# Patient Record
Sex: Female | Born: 1945 | Race: White | Hispanic: No | Smoking: Never smoker
Health system: Southern US, Community
[De-identification: ages and names within clinical notes are randomized; demographics above are authoritative.]

## PROBLEM LIST (undated history)

## (undated) DIAGNOSIS — R42 Dizziness and giddiness: Secondary | ICD-10-CM

## (undated) DIAGNOSIS — Z780 Asymptomatic menopausal state: Secondary | ICD-10-CM

## (undated) DIAGNOSIS — I872 Venous insufficiency (chronic) (peripheral): Secondary | ICD-10-CM

## (undated) DIAGNOSIS — H9190 Unspecified hearing loss, unspecified ear: Secondary | ICD-10-CM

## (undated) DIAGNOSIS — I82409 Acute embolism and thrombosis of unspecified deep veins of unspecified lower extremity: Secondary | ICD-10-CM

## (undated) DIAGNOSIS — E559 Vitamin D deficiency, unspecified: Secondary | ICD-10-CM

## (undated) DIAGNOSIS — N200 Calculus of kidney: Secondary | ICD-10-CM

## (undated) DIAGNOSIS — K219 Gastro-esophageal reflux disease without esophagitis: Secondary | ICD-10-CM

## (undated) DIAGNOSIS — L719 Rosacea, unspecified: Secondary | ICD-10-CM

## (undated) DIAGNOSIS — J309 Allergic rhinitis, unspecified: Secondary | ICD-10-CM

## (undated) DIAGNOSIS — E785 Hyperlipidemia, unspecified: Secondary | ICD-10-CM

## (undated) DIAGNOSIS — H8109 Meniere's disease, unspecified ear: Secondary | ICD-10-CM

## (undated) DIAGNOSIS — M1990 Unspecified osteoarthritis, unspecified site: Secondary | ICD-10-CM

## (undated) DIAGNOSIS — M810 Age-related osteoporosis without current pathological fracture: Secondary | ICD-10-CM

## (undated) DIAGNOSIS — N289 Disorder of kidney and ureter, unspecified: Secondary | ICD-10-CM

## (undated) DIAGNOSIS — E042 Nontoxic multinodular goiter: Secondary | ICD-10-CM

## (undated) HISTORY — DX: Venous insufficiency (chronic) (peripheral): I87.2

## (undated) HISTORY — PX: TONSILLECTOMY: SUR1361

## (undated) HISTORY — DX: Dizziness and giddiness: R42

## (undated) HISTORY — DX: Gastro-esophageal reflux disease without esophagitis: K21.9

## (undated) HISTORY — DX: Meniere's disease, unspecified ear: H81.09

## (undated) HISTORY — DX: Unspecified osteoarthritis, unspecified site: M19.90

## (undated) HISTORY — DX: Nontoxic multinodular goiter: E04.2

## (undated) HISTORY — DX: Acute embolism and thrombosis of unspecified deep veins of unspecified lower extremity: I82.409

## (undated) HISTORY — PX: LITHOTRIPSY: SUR834

## (undated) HISTORY — DX: Unspecified hearing loss, unspecified ear: H91.90

## (undated) HISTORY — DX: Asymptomatic menopausal state: Z78.0

## (undated) HISTORY — DX: Allergic rhinitis, unspecified: J30.9

## (undated) HISTORY — DX: Vitamin D deficiency, unspecified: E55.9

## (undated) HISTORY — DX: Age-related osteoporosis without current pathological fracture: M81.0

## (undated) HISTORY — DX: Rosacea, unspecified: L71.9

---

## 1988-07-09 HISTORY — PX: TOTAL ABDOMINAL HYSTERECTOMY W/ BILATERAL SALPINGOOPHORECTOMY: SHX83

## 2003-07-10 HISTORY — PX: CARDIAC CATHETERIZATION: SHX172

## 2007-11-11 ENCOUNTER — Encounter: Admission: RE | Admit: 2007-11-11 | Discharge: 2007-11-11 | Payer: Self-pay | Admitting: Rheumatology

## 2008-06-24 ENCOUNTER — Ambulatory Visit: Payer: Self-pay | Admitting: Diagnostic Radiology

## 2008-06-24 ENCOUNTER — Ambulatory Visit (HOSPITAL_BASED_OUTPATIENT_CLINIC_OR_DEPARTMENT_OTHER): Admission: RE | Admit: 2008-06-24 | Discharge: 2008-06-24 | Payer: Self-pay | Admitting: Family Medicine

## 2008-06-25 ENCOUNTER — Emergency Department (HOSPITAL_BASED_OUTPATIENT_CLINIC_OR_DEPARTMENT_OTHER): Admission: EM | Admit: 2008-06-25 | Discharge: 2008-06-25 | Payer: Self-pay | Admitting: Emergency Medicine

## 2008-06-25 ENCOUNTER — Ambulatory Visit: Payer: Self-pay | Admitting: Diagnostic Radiology

## 2008-07-09 DIAGNOSIS — I82409 Acute embolism and thrombosis of unspecified deep veins of unspecified lower extremity: Secondary | ICD-10-CM

## 2008-07-09 HISTORY — DX: Acute embolism and thrombosis of unspecified deep veins of unspecified lower extremity: I82.409

## 2008-07-29 ENCOUNTER — Emergency Department (HOSPITAL_BASED_OUTPATIENT_CLINIC_OR_DEPARTMENT_OTHER): Admission: EM | Admit: 2008-07-29 | Discharge: 2008-07-29 | Payer: Self-pay | Admitting: Emergency Medicine

## 2008-07-29 ENCOUNTER — Ambulatory Visit: Payer: Self-pay | Admitting: Radiology

## 2009-02-24 ENCOUNTER — Ambulatory Visit: Payer: Self-pay | Admitting: Diagnostic Radiology

## 2009-02-24 ENCOUNTER — Ambulatory Visit (HOSPITAL_BASED_OUTPATIENT_CLINIC_OR_DEPARTMENT_OTHER): Admission: RE | Admit: 2009-02-24 | Discharge: 2009-02-24 | Payer: Self-pay | Admitting: Family Medicine

## 2009-09-16 ENCOUNTER — Ambulatory Visit: Payer: Self-pay | Admitting: Diagnostic Radiology

## 2009-09-16 ENCOUNTER — Ambulatory Visit (HOSPITAL_BASED_OUTPATIENT_CLINIC_OR_DEPARTMENT_OTHER): Admission: RE | Admit: 2009-09-16 | Discharge: 2009-09-16 | Payer: Self-pay | Admitting: Family Medicine

## 2010-02-02 ENCOUNTER — Encounter: Admission: RE | Admit: 2010-02-02 | Discharge: 2010-02-02 | Payer: Self-pay | Admitting: Otolaryngology

## 2010-10-15 IMAGING — CR DG CHEST 2V
2 series · 2 of 2 positions shown · non-contrast
Comparison: 11/11/2007 study

CLINICAL DATA: History given of coughing and shortness of breath.
There is a history of asthma.

CHEST - 2 VIEW

[w chest pa]
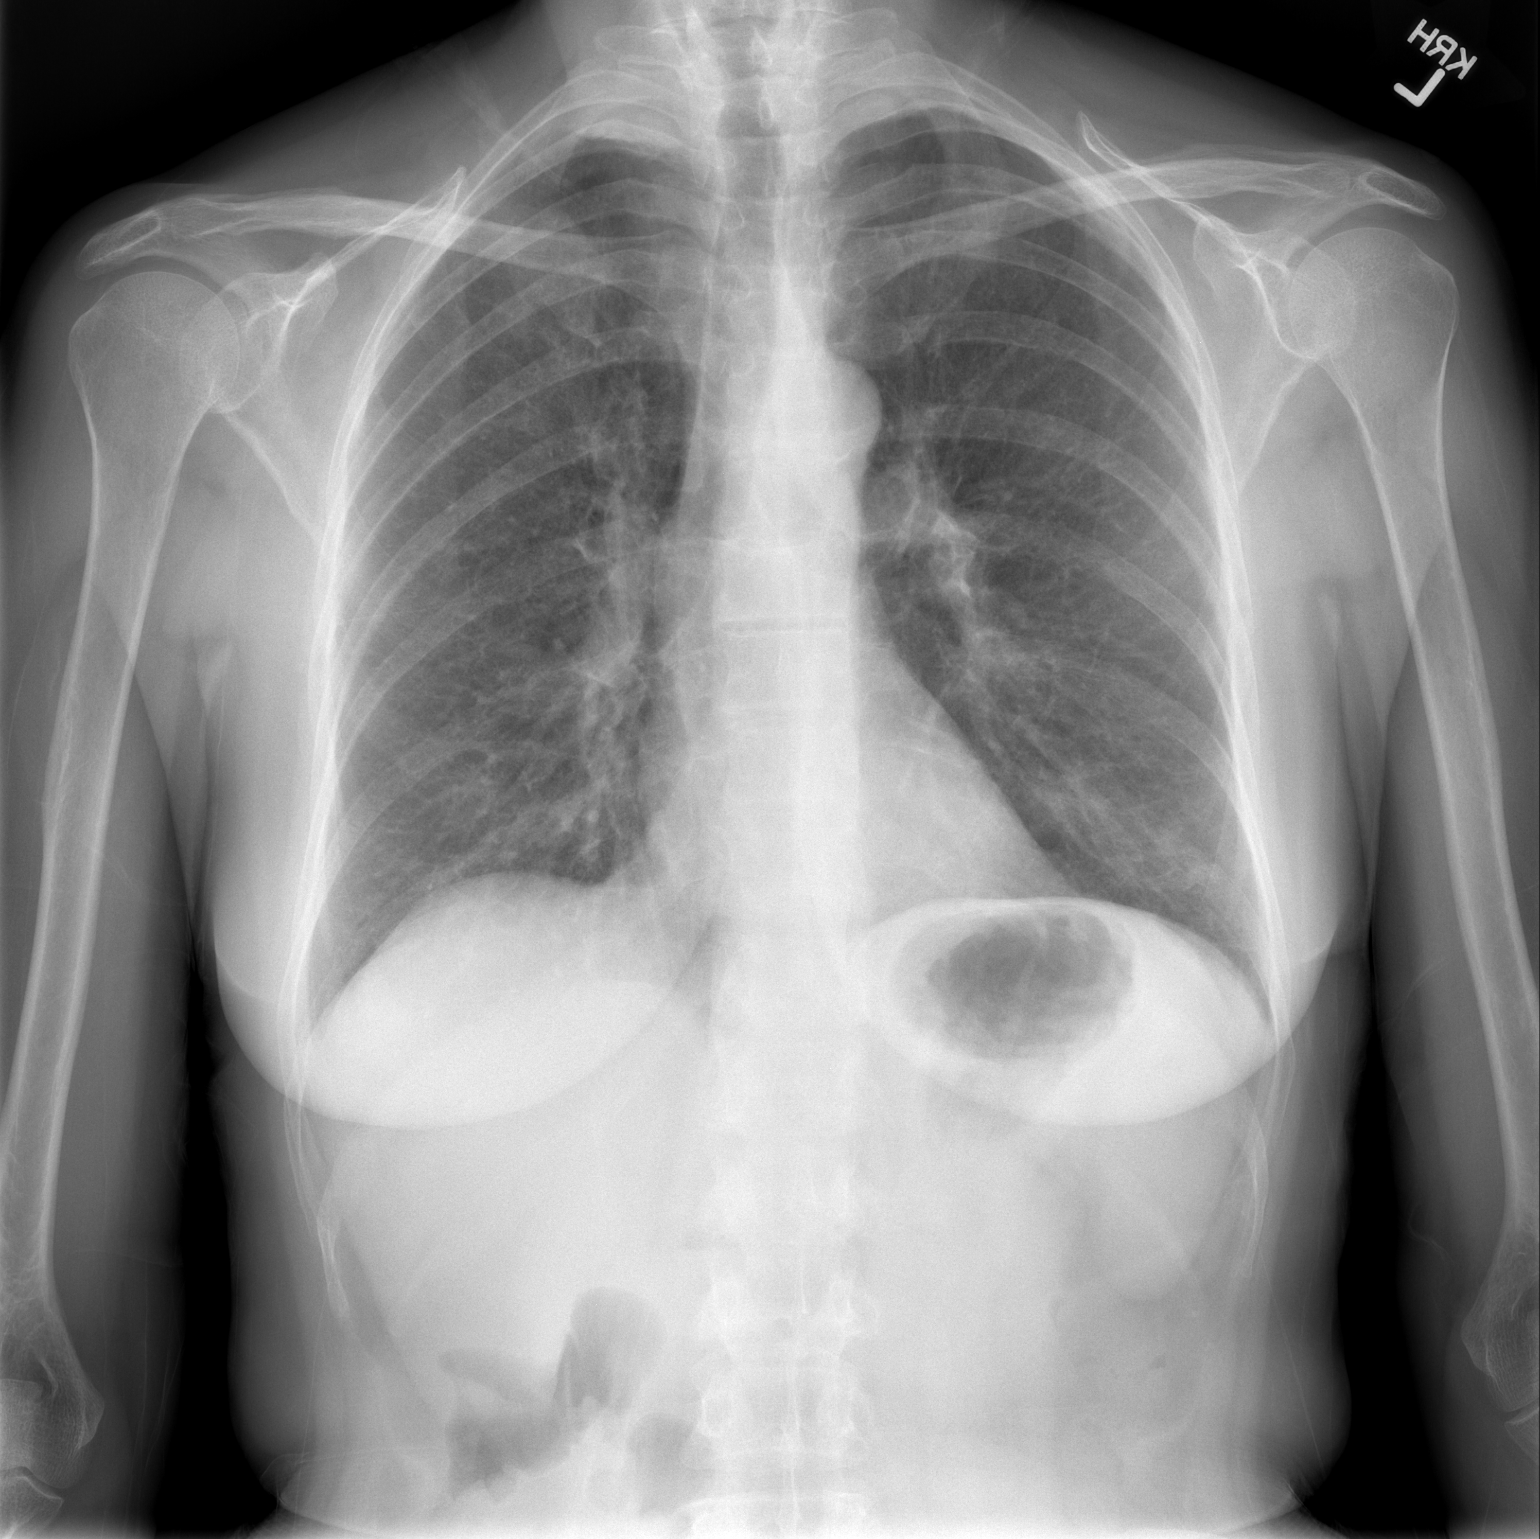

[w chest lat]
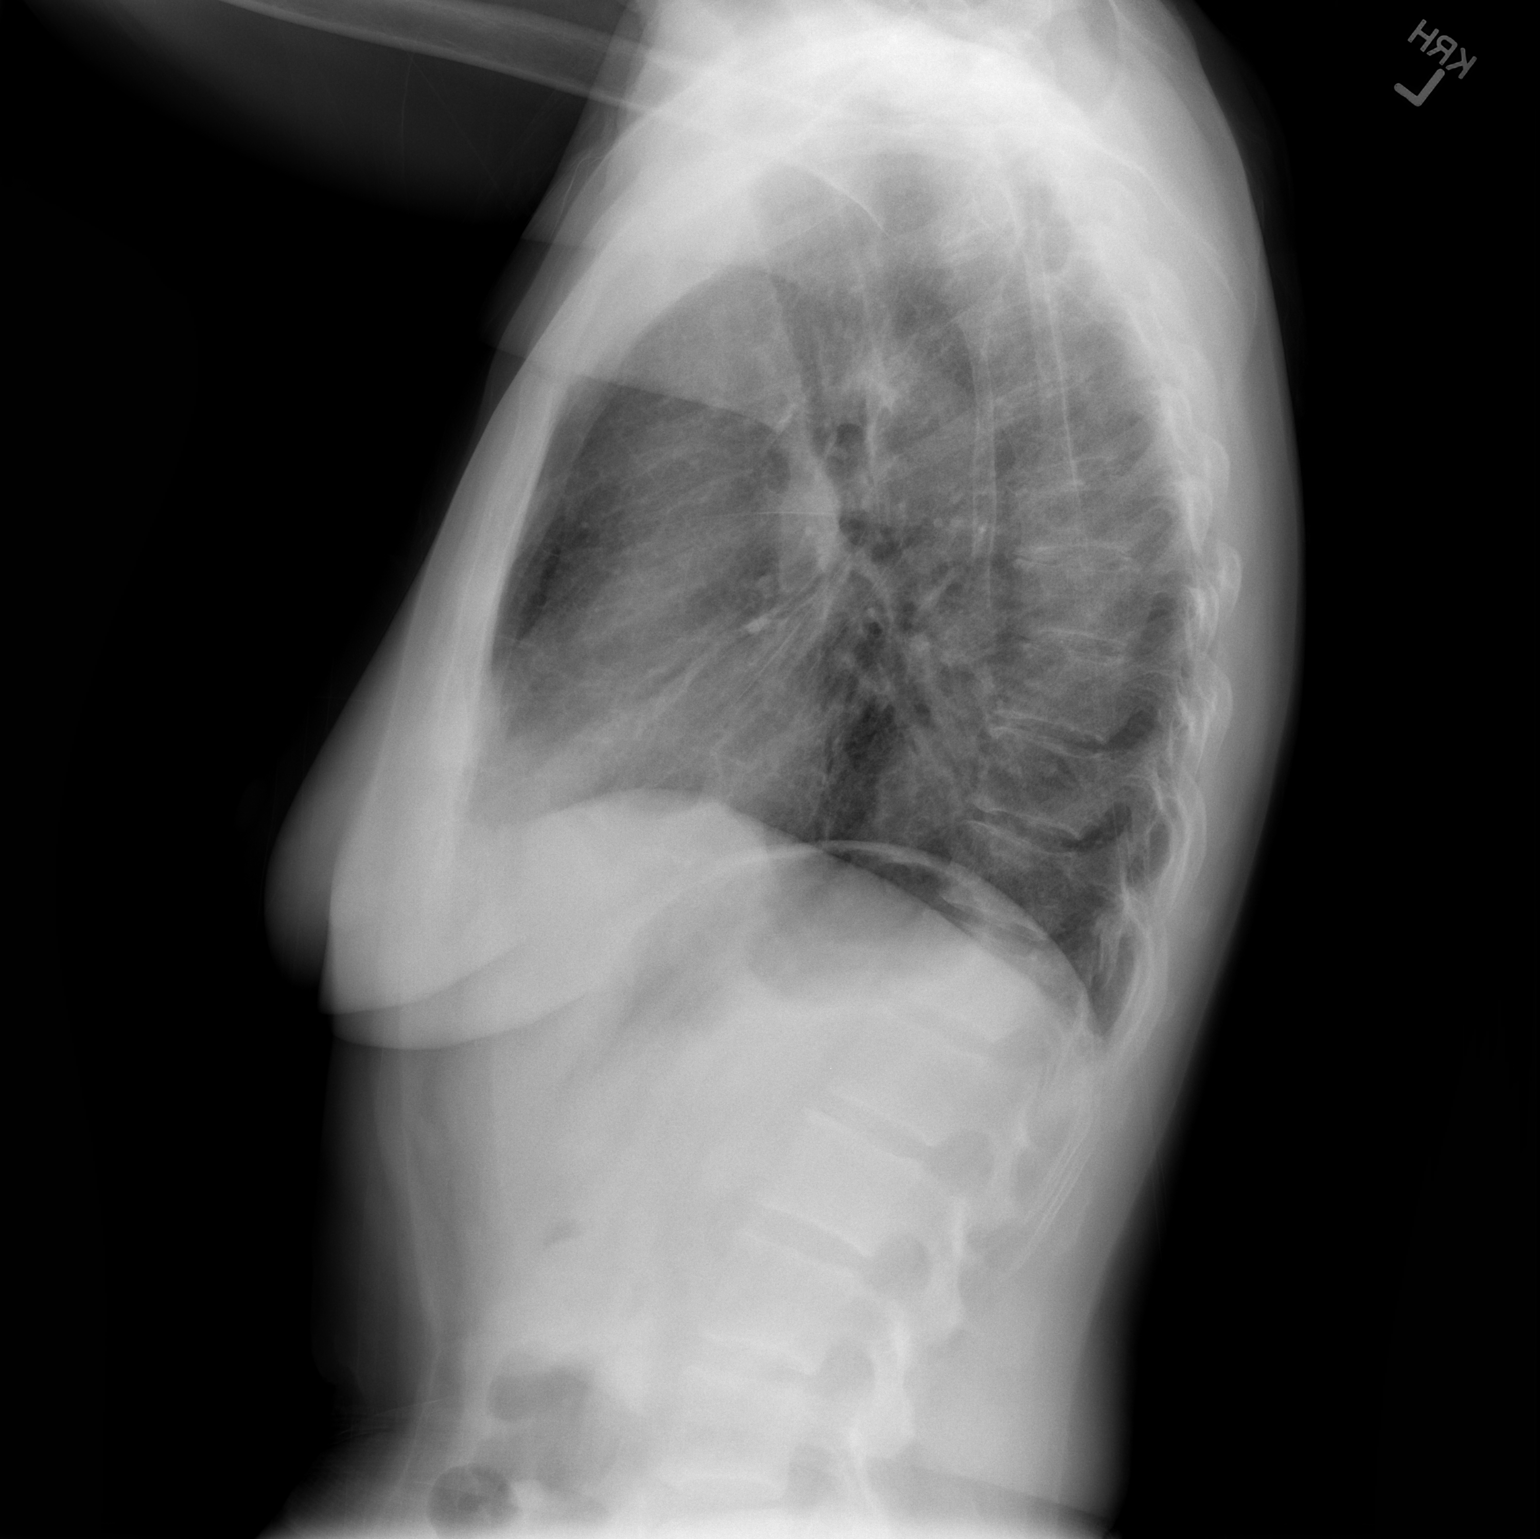

[2 of 2 positions shown; findings below may reference images not displayed]

FINDINGS: Cardiac silhouette is normal size and shape.  Lungs are
free of infiltrates.  There is slight hyperinflation configuration
with very slight flattening of diaphragm on lateral image.  No
pleural effusion is evident.  Nonaneurysmal aortic calcification is
present.  There is a slightly osteopenic appearance of the bones
with degenerative spondylosis.
IMPRESSION: There is a slight hyperinflation configuration with slight
flattening of diaphragm on lateral image.  No pulmonary edema,
pneumonia, pleural effusion or other acute process is seen.

## 2010-10-23 LAB — CBC
MCHC: 33.7 g/dL (ref 30.0–36.0)
MCV: 94.6 fL (ref 78.0–100.0)
RBC: 4.13 MIL/uL (ref 3.87–5.11)
RDW: 12 % (ref 11.5–15.5)

## 2010-10-23 LAB — DIFFERENTIAL
Basophils Relative: 1 % (ref 0–1)
Eosinophils Absolute: 0.1 10*3/uL (ref 0.0–0.7)
Eosinophils Relative: 2 % (ref 0–5)
Lymphs Abs: 1.5 10*3/uL (ref 0.7–4.0)
Monocytes Relative: 11 % (ref 3–12)

## 2010-10-23 LAB — BASIC METABOLIC PANEL
CO2: 25 mEq/L (ref 19–32)
Chloride: 99 mEq/L (ref 96–112)
Creatinine, Ser: 1.7 mg/dL — ABNORMAL HIGH (ref 0.4–1.2)
GFR calc Af Amer: 37 mL/min — ABNORMAL LOW (ref >60)
Glucose, Bld: 142 mg/dL — ABNORMAL HIGH (ref 70–99)

## 2010-10-23 LAB — SEDIMENTATION RATE: Sed Rate: 36 mm/hr — ABNORMAL HIGH (ref 0–22)

## 2010-10-23 LAB — CK: Total CK: 30 U/L (ref 7–177)

## 2010-12-07 ENCOUNTER — Ambulatory Visit (HOSPITAL_BASED_OUTPATIENT_CLINIC_OR_DEPARTMENT_OTHER)
Admission: RE | Admit: 2010-12-07 | Discharge: 2010-12-07 | Disposition: A | Discharge status: Home / Self Care | Payer: Medicare Other | Source: Ambulatory Visit | Attending: Family Medicine | Admitting: Family Medicine

## 2010-12-07 ENCOUNTER — Other Ambulatory Visit (HOSPITAL_BASED_OUTPATIENT_CLINIC_OR_DEPARTMENT_OTHER): Payer: Self-pay | Admitting: Family Medicine

## 2010-12-07 DIAGNOSIS — M79606 Pain in leg, unspecified: Secondary | ICD-10-CM

## 2010-12-07 DIAGNOSIS — M79609 Pain in unspecified limb: Secondary | ICD-10-CM | POA: Insufficient documentation

## 2011-02-27 ENCOUNTER — Encounter: Payer: Self-pay | Admitting: *Deleted

## 2011-02-27 ENCOUNTER — Emergency Department (HOSPITAL_BASED_OUTPATIENT_CLINIC_OR_DEPARTMENT_OTHER)
Admission: EM | Admit: 2011-02-27 | Discharge: 2011-02-27 | Disposition: A | Discharge status: Home / Self Care | Payer: Medicare Other | Attending: Emergency Medicine | Admitting: Emergency Medicine

## 2011-02-27 DIAGNOSIS — W2203XA Walked into furniture, initial encounter: Secondary | ICD-10-CM | POA: Insufficient documentation

## 2011-02-27 DIAGNOSIS — Y92009 Unspecified place in unspecified non-institutional (private) residence as the place of occurrence of the external cause: Secondary | ICD-10-CM | POA: Insufficient documentation

## 2011-02-27 DIAGNOSIS — S91009A Unspecified open wound, unspecified ankle, initial encounter: Secondary | ICD-10-CM | POA: Insufficient documentation

## 2011-02-27 DIAGNOSIS — E785 Hyperlipidemia, unspecified: Secondary | ICD-10-CM | POA: Insufficient documentation

## 2011-02-27 DIAGNOSIS — J45909 Unspecified asthma, uncomplicated: Secondary | ICD-10-CM | POA: Insufficient documentation

## 2011-02-27 DIAGNOSIS — S81009A Unspecified open wound, unspecified knee, initial encounter: Secondary | ICD-10-CM | POA: Insufficient documentation

## 2011-02-27 DIAGNOSIS — S81811A Laceration without foreign body, right lower leg, initial encounter: Secondary | ICD-10-CM

## 2011-02-27 HISTORY — DX: Hyperlipidemia, unspecified: E78.5

## 2011-02-27 HISTORY — DX: Calculus of kidney: N20.0

## 2011-02-27 MED ORDER — LIDOCAINE HCL 2 % IJ SOLN
5.0000 mL | Freq: Once | INTRAMUSCULAR | Status: AC
  Administered 2011-02-27: 100 mg
  Filled 2011-02-27: qty 1

## 2011-02-27 NOTE — ED Provider Notes (Signed)
History     CSN: [id number] Arrival date & time: 02/27/2011  5:22 PM  Chief Complaint  Patient presents with  . Laceration   HPI Comments: Patient suffered a laceration of the right lower leg on a piece of furniture in her home today. She had a tetanus shot about 3 years ago.  Patient is a 65 y.o. female presenting with skin laceration. The history is provided by the patient. No language interpreter was used.  Laceration  The incident occurred less than 1 hour ago. The laceration is located on the right leg. The laceration is 3 cm in size. Injury mechanism: Her laceration was caused by bumping into a piece of furniture. The pain is at a severity of 8/10. The pain is moderate. The pain has been constant since onset. She reports no foreign bodies present. Her tetanus status is UTD.    Past Medical History  Diagnosis Date  . Hyperlipemia   . Asthma   . Kidney calculi     Past Surgical History  Procedure Date  . Abdominal hysterectomy   . Tonsillectomy   . Cardiac catheterization   . Lithotripsy     History reviewed. No pertinent family history.  History  Substance Use Topics  . Smoking status: Never Smoker   . Smokeless tobacco: Not on file  . Alcohol Use: No    OB History    Grav Para Term Preterm Abortions TAB SAB Ect Mult Living                  Review of Systems  All other systems reviewed and are negative.    Physical Exam  BP 115/58  Pulse 83  Temp 98.1 F (36.7 C)  Resp 16  Wt 132 lb (59.875 kg)  SpO2 100%  Physical Exam  Constitutional: She is oriented to person, place, and time.       Patient is a slender elderly lady in mild distress. She has a splint on her right hand.  HENT:  Head: Normocephalic and atraumatic.  Eyes: EOM are normal.  Neck: Normal range of motion. Neck supple.  Cardiovascular: Normal rate and regular rhythm.   Pulmonary/Chest: Effort normal and breath sounds normal.  Abdominal: Soft.  Musculoskeletal: Normal range of  motion.  Neurological: She is alert and oriented to person, place, and time.  Skin:       She has a 3 cm laterally based flap laceration on the lateral aspect of her right calf. There is no foreign body in the wound. There is no loss of sensation or tendon function in the right foot.  Psychiatric: She has a normal mood and affect. Her behavior is normal.    ED Course  LACERATION REPAIR Date/Time: 02/27/2011 7:30 PM Performed by: Osvaldo Human Authorized by: Osvaldo Human Consent: Verbal consent obtained. Consent given by: patient Patient understanding: patient states understanding of the procedure being performed Patient consent: the patient's understanding of the procedure matches consent given Patient identity confirmed: verbally with patient Time out: Immediately prior to procedure a "time out" was called to verify the correct patient, procedure, equipment, support staff and site/side marked as required. Body area: lower extremity Location details: right lower leg Laceration length: 3 cm Foreign bodies: no foreign bodies Tendon involvement: none Nerve involvement: none Vascular damage: yes Anesthesia: local infiltration Local anesthetic: lidocaine 2% without epinephrine Patient sedated: no Irrigation solution: saline Amount of cleaning: standard Debridement: none Degree of undermining: none Skin closure: 5-0 nylon Number of sutures:  7 Technique: simple Approximation: loose Approximation difficulty: simple Dressing: 4x4 sterile gauze and pressure dressing Patient tolerance: Patient tolerated the procedure well with no immediate complications.         Carleene Cooper III, MD 02/27/11 [phone]

## 2011-02-27 NOTE — ED Notes (Signed)
Dr Ignacia Palma at bedside to suture pt

## 2011-02-27 NOTE — ED Notes (Signed)
Wet to Dry dressing applied to wound.

## 2011-02-27 NOTE — ED Notes (Signed)
Pt  C/o laceration to right lower leg x 30 mins ago by wooden table

## 2011-03-09 ENCOUNTER — Emergency Department (HOSPITAL_BASED_OUTPATIENT_CLINIC_OR_DEPARTMENT_OTHER)
Admission: EM | Admit: 2011-03-09 | Discharge: 2011-03-09 | Disposition: A | Discharge status: Home / Self Care | Payer: Medicare Other | Attending: Emergency Medicine | Admitting: Emergency Medicine

## 2011-03-09 ENCOUNTER — Encounter (HOSPITAL_BASED_OUTPATIENT_CLINIC_OR_DEPARTMENT_OTHER): Payer: Self-pay

## 2011-03-09 DIAGNOSIS — IMO0002 Reserved for concepts with insufficient information to code with codable children: Secondary | ICD-10-CM

## 2011-03-09 DIAGNOSIS — J45909 Unspecified asthma, uncomplicated: Secondary | ICD-10-CM | POA: Insufficient documentation

## 2011-03-09 DIAGNOSIS — Z4802 Encounter for removal of sutures: Secondary | ICD-10-CM | POA: Insufficient documentation

## 2011-03-09 DIAGNOSIS — E785 Hyperlipidemia, unspecified: Secondary | ICD-10-CM | POA: Insufficient documentation

## 2011-03-09 NOTE — ED Provider Notes (Signed)
History     CSN: [id number] Arrival date & time: 03/09/2011  6:29 PM  Chief Complaint  Patient presents with  . Suture / Staple Removal   Patient is a 65 y.o. female presenting with suture removal. The history is provided by the patient. No language interpreter was used.  Suture / Staple Removal  The sutures were placed 11 to 14 days ago. Treatments since wound repair include antibiotic ointment use. Her temperature was unmeasured prior to arrival. There has been no drainage from the wound. There is no redness present. There is no swelling present. The pain has improved. She has no difficulty moving the affected extremity or digit.    Past Medical History  Diagnosis Date  . Hyperlipemia   . Asthma   . Kidney calculi     Past Surgical History  Procedure Date  . Abdominal hysterectomy   . Tonsillectomy   . Cardiac catheterization   . Lithotripsy     No family history on file.  History  Substance Use Topics  . Smoking status: Never Smoker   . Smokeless tobacco: Not on file  . Alcohol Use: No    OB History    Grav Para Term Preterm Abortions TAB SAB Ect Mult Living                  Review of Systems  Skin: Positive for wound.  All other systems reviewed and are negative.    Physical Exam  BP 105/57  Pulse 84  Temp(Src) 98.3 F (36.8 C) (Oral)  Resp 16  Ht 5\' 1"  (1.549 m)  Wt 132 lb (59.875 kg)  BMI 24.94 kg/m2  SpO2 98%  Physical Exam  Nursing note and vitals reviewed. Constitutional: She appears well-developed and well-nourished.  HENT:  Head: Normocephalic.  Neurological: She is alert.  Skin: Skin is warm.       Healing laceration,  nv and ns intact    ED Course  Procedures  MDM Sutures removed      Langston Masker, [place] 03/09/11 1922

## 2011-03-09 NOTE — ED Notes (Signed)
Suture removal right LE

## 2011-03-09 NOTE — ED Notes (Signed)
Report given to Kyle Dangerfield, RN 

## 2011-03-10 NOTE — ED Provider Notes (Signed)
Medical screening examination/treatment/procedure(s) were performed by non-physician practitioner and as supervising physician I was immediately available for consultation/collaboration.   Leigh-Ann Webb, MD 03/10/11 0112 

## 2011-04-13 LAB — COMPREHENSIVE METABOLIC PANEL
ALT: 19 U/L (ref 0–35)
Albumin: 4.4 g/dL (ref 3.5–5.2)
Calcium: 9.8 mg/dL (ref 8.4–10.5)
GFR calc Af Amer: 39 mL/min — ABNORMAL LOW (ref >60)
GFR calc non Af Amer: 33 mL/min — ABNORMAL LOW (ref >60)
Glucose, Bld: 141 mg/dL — ABNORMAL HIGH (ref 70–99)
Potassium: 3.9 mEq/L (ref 3.5–5.1)
Sodium: 136 mEq/L (ref 135–145)
Total Bilirubin: 1.1 mg/dL (ref 0.3–1.2)
Total Protein: 8 g/dL (ref 6.0–8.3)

## 2011-04-13 LAB — DIFFERENTIAL
Basophils Absolute: 0.2 10*3/uL — ABNORMAL HIGH (ref 0.0–0.1)
Basophils Relative: 2 % — ABNORMAL HIGH (ref 0–1)
Eosinophils Relative: 0 % (ref 0–5)
Lymphocytes Relative: 13 % (ref 12–46)
Neutro Abs: 8.1 10*3/uL — ABNORMAL HIGH (ref 1.7–7.7)

## 2011-04-13 LAB — URINALYSIS, ROUTINE W REFLEX MICROSCOPIC
Bilirubin Urine: NEGATIVE
Nitrite: NEGATIVE
Protein, ur: NEGATIVE mg/dL
Specific Gravity, Urine: 1.009 (ref 1.005–1.030)
Urobilinogen, UA: 0.2 mg/dL (ref 0.0–1.0)

## 2011-04-13 LAB — CBC
MCHC: 35.1 g/dL (ref 30.0–36.0)
Platelets: 284 10*3/uL (ref 150–400)
RDW: 12.7 % (ref 11.5–15.5)

## 2011-04-13 LAB — LIPASE, BLOOD: Lipase: 20 U/L — ABNORMAL LOW (ref 23–300)

## 2011-04-13 LAB — POCT CARDIAC MARKERS: Myoglobin, poc: >500 ng/mL (ref 12–200)

## 2011-12-20 ENCOUNTER — Ambulatory Visit (HOSPITAL_BASED_OUTPATIENT_CLINIC_OR_DEPARTMENT_OTHER)
Admission: RE | Admit: 2011-12-20 | Discharge: 2011-12-20 | Disposition: A | Discharge status: Home / Self Care | Payer: Medicare Other | Source: Ambulatory Visit | Attending: Internal Medicine | Admitting: Internal Medicine

## 2011-12-20 ENCOUNTER — Encounter: Payer: Self-pay | Admitting: Internal Medicine

## 2011-12-20 ENCOUNTER — Other Ambulatory Visit: Payer: Self-pay | Admitting: Internal Medicine

## 2011-12-20 ENCOUNTER — Ambulatory Visit (INDEPENDENT_AMBULATORY_CARE_PROVIDER_SITE_OTHER): Payer: Medicare Other | Admitting: Internal Medicine

## 2011-12-20 VITALS — BP 104/64 | HR 98 | Temp 97.1°F | Resp 16 | Ht 61.0 in | Wt 135.0 lb

## 2011-12-20 DIAGNOSIS — E785 Hyperlipidemia, unspecified: Secondary | ICD-10-CM

## 2011-12-20 DIAGNOSIS — M7989 Other specified soft tissue disorders: Secondary | ICD-10-CM | POA: Insufficient documentation

## 2011-12-20 DIAGNOSIS — R52 Pain, unspecified: Secondary | ICD-10-CM

## 2011-12-20 DIAGNOSIS — J309 Allergic rhinitis, unspecified: Secondary | ICD-10-CM

## 2011-12-20 DIAGNOSIS — K219 Gastro-esophageal reflux disease without esophagitis: Secondary | ICD-10-CM

## 2011-12-20 DIAGNOSIS — M1990 Unspecified osteoarthritis, unspecified site: Secondary | ICD-10-CM

## 2011-12-20 DIAGNOSIS — Z86718 Personal history of other venous thrombosis and embolism: Secondary | ICD-10-CM

## 2011-12-20 DIAGNOSIS — R609 Edema, unspecified: Secondary | ICD-10-CM

## 2011-12-20 DIAGNOSIS — Z8639 Personal history of other endocrine, nutritional and metabolic disease: Secondary | ICD-10-CM

## 2011-12-20 DIAGNOSIS — Z780 Asymptomatic menopausal state: Secondary | ICD-10-CM

## 2011-12-20 DIAGNOSIS — R600 Localized edema: Secondary | ICD-10-CM

## 2011-12-20 DIAGNOSIS — Z87442 Personal history of urinary calculi: Secondary | ICD-10-CM

## 2011-12-20 NOTE — Progress Notes (Signed)
Subjective:    Patient ID: Betty Atkinson, female    DOB: [birth year], 66 y.o.   MRN: [id number]  HPI Betty Atkinson is a pt. Of Dr. Alberteen Sam here with acute visit.  She has a PMH of RLE DVT treated with 6 months of Coumadin.  She has been having pain in R leg.    Some swelling in R ankle 2 months ago but none now.  Swelling will occur at times in both legs.    Allergies  Allergen Reactions  . Methotrexate Derivatives Anaphylaxis    Cough, liver failure  . Lyrica (Pregabalin)     Slurred speech, blurred vision, confusion, incohert  . Prednisone Swelling    Hives on face  . Sulfa Antibiotics Diarrhea    indigestion   Past Medical History  Diagnosis Date  . Hyperlipemia   . Asthma   . Kidney calculi   . DVT (deep venous thrombosis)   . Meniere's disease   . Syncope    Past Surgical History  Procedure Date  . Abdominal hysterectomy   . Tonsillectomy   . Cardiac catheterization   . Lithotripsy    History   Social History  . Marital Status: Married    Spouse Name: N/A    Number of Children: N/A  . Years of Education: N/A   Occupational History  . retired    Social History Main Topics  . Smoking status: Never Smoker   . Smokeless tobacco: Never Used  . Alcohol Use: No  . Drug Use: Not on file  . Sexually Active: Yes -- Female partner(s)   Other Topics Concern  . Not on file   Social History Narrative  . No narrative on file   Family History  Problem Relation Age of Onset  . Thyroid disease Father   . Heart disease Mother   . Heart disease Father   . Cancer Brother     fibrohisteofibroma  . Heart disease Brother    Patient Active Problem List  Diagnosis  . History of DVT (deep vein thrombosis)  . H/O renal calculi  . Personal history of goiter  . DJD (degenerative joint disease)  . Other and unspecified hyperlipidemia  . GERD (gastroesophageal reflux disease)  . Menopause  . Allergic rhinitis   Current Outpatient Prescriptions on File Prior to Visit  Medication  Sig Dispense Refill  . acetaminophen (TYLENOL) 500 MG tablet Take 1,000 mg by mouth daily as needed.        Marland Kitchen aspirin 81 MG chewable tablet Chew 81 mg by mouth daily.        . carboxymethylcellulose (REFRESH PLUS) 0.5 % SOLN Place 2 drops into both eyes 3 (three) times daily as needed. Seasonal allergies       . diazepam (VALIUM) 5 MG tablet Take 5 mg by mouth every 6 (six) hours as needed. Manire's disease flare up       . ezetimibe (ZETIA) 10 MG tablet Take 10 mg by mouth daily.        Marland Kitchen levothyroxine (SYNTHROID, LEVOTHROID) 50 MCG tablet Take 50 mcg by mouth daily.        . Magnesium 500 MG CAPS Take 1 capsule by mouth daily.        . mometasone (ASMANEX) 220 MCG/INH inhaler Inhale 1 puff into the lungs daily.        . mometasone (NASONEX) 50 MCG/ACT nasal spray Place 2 sprays into the nose at bedtime.        . montelukast (SINGULAIR)  10 MG tablet Take 10 mg by mouth at bedtime.        Marland Kitchen omeprazole (PRILOSEC) 40 MG capsule Take 40 mg by mouth daily.        . phenylephrine (SUDAFED PE) 10 MG TABS Take 20 mg by mouth daily.        . potassium chloride (KLOR-CON) 10 MEQ CR tablet Take 10 mEq by mouth daily.        . Riboflavin (VITAMIN B-2 PO) Take 400 mg by mouth 2 (two) times daily.        . rosuvastatin (CRESTOR) 40 MG tablet Take 40 mg by mouth daily.        Marland Kitchen triamterene-hydrochlorothiazide (DYAZIDE) 37.5-25 MG per capsule Take 1 capsule by mouth every morning.        . Vitamin D, Ergocalciferol, (DRISDOL) 50000 UNITS CAPS Take 50,000 Units by mouth every 7 (seven) days. Take on Sunday       . vitamin E 400 UNIT capsule Take 400 Units by mouth daily.             Review of Systems See HPI    Objective:   Physical Exam Physical Exam  Nursing note and vitals reviewed.  Constitutional: She is oriented to person, place, and time. She appears well-developed and well-nourished.  HENT:  Head: Normocephalic and atraumatic.  Cardiovascular: Normal rate and regular rhythm. Exam reveals no  gallop and no friction rub.  No murmur heard.  Pulmonary/Chest: Breath sounds normal. She has no wheezes. She has no rales.  Neurological: She is alert and oriented to person, place, and time.  Skin: Skin is warm and dry.  Psychiatric: She has a normal mood and affect. Her behavior is normal. Ext:  Multiple venous varicosities. 14 cm calf measurement bilaterally same size.  Homan's sing neg.  Good bilateral pedal pulses         Assessment & Plan:  Leg edema  Will get venous doppler bilaterally today  Further management based on results  History of DVT

## 2011-12-20 NOTE — Patient Instructions (Signed)
To have venous doppler today

## 2012-03-03 LAB — HM DEXA SCAN

## 2012-03-03 LAB — HM MAMMOGRAPHY

## 2012-12-18 ENCOUNTER — Ambulatory Visit: Payer: Medicare Other | Admitting: Family Medicine

## 2012-12-22 ENCOUNTER — Ambulatory Visit: Payer: Medicare Other | Admitting: Family Medicine

## 2012-12-24 ENCOUNTER — Other Ambulatory Visit: Payer: Self-pay | Admitting: *Deleted

## 2012-12-24 DIAGNOSIS — E785 Hyperlipidemia, unspecified: Secondary | ICD-10-CM

## 2012-12-24 DIAGNOSIS — E039 Hypothyroidism, unspecified: Secondary | ICD-10-CM

## 2012-12-25 ENCOUNTER — Other Ambulatory Visit: Payer: Medicare Other

## 2012-12-25 LAB — CBC WITH DIFFERENTIAL/PLATELET
Basophils Absolute: 0 10*3/uL (ref 0.0–0.1)
Basophils Relative: 0 % (ref 0–1)
Eosinophils Absolute: 0 10*3/uL (ref 0.0–0.7)
Eosinophils Relative: 1 % (ref 0–5)
HCT: 41.7 % (ref 36.0–46.0)
Hemoglobin: 14 g/dL (ref 12.0–15.0)
Lymphocytes Relative: 53 % — ABNORMAL HIGH (ref 12–46)
Lymphs Abs: 3.2 10*3/uL (ref 0.7–4.0)
MCH: 31 pg (ref 26.0–34.0)
MCHC: 33.6 g/dL (ref 30.0–36.0)
MCV: 92.5 fL (ref 78.0–100.0)
Monocytes Absolute: 0.5 10*3/uL (ref 0.1–1.0)
Monocytes Relative: 8 % (ref 3–12)
Neutro Abs: 2.3 10*3/uL (ref 1.7–7.7)
Neutrophils Relative %: 38 % — ABNORMAL LOW (ref 43–77)
Platelets: 236 10*3/uL (ref 150–400)
RBC: 4.51 MIL/uL (ref 3.87–5.11)
RDW: 14.9 % (ref 11.5–15.5)
WBC: 6 10*3/uL (ref 4.0–10.5)

## 2012-12-25 LAB — TSH: TSH: 3.407 u[IU]/mL (ref 0.350–4.500)

## 2012-12-25 LAB — LIPID PANEL
Cholesterol: 178 mg/dL (ref 0–200)
HDL: 78 mg/dL (ref >39)
LDL Cholesterol: 85 mg/dL (ref 0–99)
Total CHOL/HDL Ratio: 2.3 Ratio
Triglycerides: 73 mg/dL (ref <150)
VLDL: 15 mg/dL (ref 0–40)

## 2012-12-25 LAB — T4, FREE: Free T4: 1.23 ng/dL (ref 0.80–1.80)

## 2012-12-26 LAB — COMPLETE METABOLIC PANEL WITH GFR
ALT: 19 U/L (ref 0–35)
AST: 19 U/L (ref 0–37)
Albumin: 4.3 g/dL (ref 3.5–5.2)
Alkaline Phosphatase: 70 U/L (ref 39–117)
BUN: 20 mg/dL (ref 6–23)
CO2: 28 mEq/L (ref 19–32)
Calcium: 9.4 mg/dL (ref 8.4–10.5)
Chloride: 103 mEq/L (ref 96–112)
Creat: 1.32 mg/dL — ABNORMAL HIGH (ref 0.50–1.10)
GFR, Est African American: 48 mL/min — ABNORMAL LOW
GFR, Est Non African American: 42 mL/min — ABNORMAL LOW
Glucose, Bld: 109 mg/dL — ABNORMAL HIGH (ref 70–99)
Potassium: 4 mEq/L (ref 3.5–5.3)
Sodium: 140 mEq/L (ref 135–145)
Total Bilirubin: 0.8 mg/dL (ref 0.3–1.2)
Total Protein: 6.6 g/dL (ref 6.0–8.3)

## 2012-12-29 ENCOUNTER — Ambulatory Visit (INDEPENDENT_AMBULATORY_CARE_PROVIDER_SITE_OTHER): Payer: Medicare Other | Admitting: Family Medicine

## 2012-12-29 ENCOUNTER — Encounter: Payer: Self-pay | Admitting: Family Medicine

## 2012-12-29 VITALS — BP 138/83 | HR 98 | Temp 97.8°F | Resp 18 | Ht 61.0 in | Wt 155.0 lb

## 2012-12-29 DIAGNOSIS — H8103 Meniere's disease, bilateral: Secondary | ICD-10-CM

## 2012-12-29 DIAGNOSIS — K219 Gastro-esophageal reflux disease without esophagitis: Secondary | ICD-10-CM

## 2012-12-29 DIAGNOSIS — R635 Abnormal weight gain: Secondary | ICD-10-CM

## 2012-12-29 DIAGNOSIS — E785 Hyperlipidemia, unspecified: Secondary | ICD-10-CM

## 2012-12-29 DIAGNOSIS — E039 Hypothyroidism, unspecified: Secondary | ICD-10-CM

## 2012-12-29 DIAGNOSIS — E876 Hypokalemia: Secondary | ICD-10-CM

## 2012-12-29 DIAGNOSIS — H8109 Meniere's disease, unspecified ear: Secondary | ICD-10-CM

## 2012-12-29 DIAGNOSIS — J45909 Unspecified asthma, uncomplicated: Secondary | ICD-10-CM

## 2012-12-29 MED ORDER — ROSUVASTATIN CALCIUM 40 MG PO TABS: ORAL_TABLET | ORAL | Status: DC

## 2012-12-29 MED ORDER — POTASSIUM CHLORIDE ER 10 MEQ PO TBCR: 10.0000 meq | EXTENDED_RELEASE_TABLET | Freq: Two times a day (BID) | ORAL | Status: DC

## 2012-12-29 MED ORDER — OMEPRAZOLE 20 MG PO CPDR: 20.0000 mg | DELAYED_RELEASE_CAPSULE | Freq: Every day | ORAL | Status: DC

## 2012-12-29 MED ORDER — MONTELUKAST SODIUM 10 MG PO TABS: 10.0000 mg | ORAL_TABLET | Freq: Every day | ORAL | Status: DC

## 2012-12-29 MED ORDER — TRIAMTERENE-HCTZ 37.5-25 MG PO TABS: 1.0000 | ORAL_TABLET | Freq: Every day | ORAL | Status: DC

## 2012-12-29 NOTE — Patient Instructions (Addendum)
1)  Weight Gain - My Fitness Pal (Figure out how many calories you are eating/burning off in a day).  For every pound you want to lose per week you need to decrease your total calorie by 500 per day (e.g 250 less in food /250 more in exercise) or 1000 will result in 2 lbs/week.  2)  Thyroid - Increase your dosage to 75 mcg per day for the next 2 months.  This might help with weight loss and will increase your energy.   Exercise to Lose Weight Exercise and a healthy diet may help you lose weight. Your doctor may suggest specific exercises. EXERCISE IDEAS AND TIPS  Choose low-cost things you enjoy doing, such as walking, bicycling, or exercising to workout videos.  Take stairs instead of the elevator.  Walk during your lunch break.  Park your car further away from work or school.  Go to a gym or an exercise class.  Start with 5 to 10 minutes of exercise each day. Build up to 30 minutes of exercise 4 to 6 days a week.  Wear shoes with good support and comfortable clothes.  Stretch before and after working out.  Work out until you breathe harder and your heart beats faster.  Drink extra water when you exercise.  Do not do so much that you hurt yourself, feel dizzy, or get very short of breath. Exercises that burn about 150 calories:  Running 1  miles in 15 minutes.  Playing volleyball for 45 to 60 minutes.  Washing and waxing a car for 45 to 60 minutes.  Playing touch football for 45 minutes.  Walking 1  miles in 35 minutes.  Pushing a stroller 1  miles in 30 minutes.  Playing basketball for 30 minutes.  Raking leaves for 30 minutes.  Bicycling 5 miles in 30 minutes.  Walking 2 miles in 30 minutes.  Dancing for 30 minutes.  Shoveling snow for 15 minutes.  Swimming laps for 20 minutes.  Walking up stairs for 15 minutes.  Bicycling 4 miles in 15 minutes.  Gardening for 30 to 45 minutes.  Jumping rope for 15 minutes.  Washing windows or floors for 45 to  60 minutes. Document Released: 07/28/2010 Document Revised: 09/17/2011 Document Reviewed: 07/28/2010 [facility] Patient Information 2014 [place], [place].  Calorie Counting Diet A calorie counting diet requires you to eat the number of calories that are right for you in a day. Calories are the measurement of how much energy you get from the food you eat. Eating the right amount of calories is important for staying at a healthy weight. If you eat too many calories, your body will store them as fat and you may gain weight. If you eat too few calories, you may lose weight. Counting the number of calories you eat during a day will help you know if you are eating the right amount. A Registered Dietitian can determine how many calories you need in a day. The amount of calories needed varies from person to person. If your goal is to lose weight, you will need to eat fewer calories. Losing weight can benefit you if you are overweight or have health problems such as heart disease, high blood pressure, or diabetes. If your goal is to gain weight, you will need to eat more calories. Gaining weight may be necessary if you have a certain health problem that causes your body to need more energy. TIPS Whether you are increasing or decreasing the number of calories you eat  during a day, it may be hard to get used to changes in what you eat and drink. The following are tips to help you keep track of the number of calories you eat.  Measure foods at home with measuring cups. This helps you know the amount of food and number of calories you are eating.  Restaurants often serve food in amounts that are larger than 1 serving. While eating out, estimate how many servings of a food you are given. For example, a serving of cooked rice is  cup or about the size of half of a fist. Knowing serving sizes will help you be aware of how much food you are eating at restaurants.  Ask for smaller portion sizes or child-size portions at  restaurants.  Plan to eat half of a meal at a restaurant. Take the rest home or share the other half with a friend.  Read the Nutrition Facts panel on food labels for calorie content and serving size. You can find out how many servings are in a package, the size of a serving, and the number of calories each serving has.  For example, a package might contain 3 cookies. The Nutrition Facts panel on that package says that 1 serving is 1 cookie. Below that, it will say there are 3 servings in the container. The calories section of the Nutrition Facts label says there are 90 calories. This means there are 90 calories in 1 cookie (1 serving). If you eat 1 cookie you have eaten 90 calories. If you eat all 3 cookies, you have eaten 270 calories (3 servings x 90 calories = 270 calories). The list below tells you how big or small some common portion sizes are.  1 oz.........4 stacked dice.  3 oz........Marland KitchenDeck of cards.  1 tsp.......Marland KitchenTip of little finger.  1 tbs......Marland KitchenMarland KitchenThumb.  2 tbs.......Marland KitchenGolf ball.   cup......Marland KitchenHalf of a fist.  1 cup.......Marland KitchenA fist. KEEP A FOOD LOG Write down every food item you eat, the amount you eat, and the number of calories in each food you eat during the day. At the end of the day, you can add up the total number of calories you have eaten. It may help to keep a list like the one below. Find out the calorie information by reading the Nutrition Facts panel on food labels. Breakfast  Bran cereal (1 cup, 110 calories).  Fat-free milk ( cup, 45 calories). Snack  Apple (1 medium, 80 calories). Lunch  Spinach (1 cup, 20 calories).  Tomato ( medium, 20 calories).  Chicken breast strips (3 oz, 165 calories).  Shredded cheddar cheese ( cup, 110 calories).  Light [place] dressing (2 tbs, 60 calories).  Whole-wheat bread (1 slice, 80 calories).  Tub margarine (1 tsp, 35 calories).  Vegetable soup (1 cup, 160 calories). Dinner  Pork chop (3 oz, 190  calories).  Brown rice (1 cup, 215 calories).  Steamed broccoli ( cup, 20 calories).  Strawberries (1  cup, 65 calories).  Whipped cream (1 tbs, 50 calories). Daily Calorie Total: 1425 Document Released: 06/25/2005 Document Revised: 09/17/2011 Document Reviewed: 12/20/2006 [employer] Patient Information 2014 [place], [place].

## 2012-12-29 NOTE — Progress Notes (Deleted)
  Subjctive:   Patient ID: Betty Atkinson, female    DOB: 05-03-1946, 67 y.o.   MRN:  [id number] HPI  1. Medication refill: Ms Kosak is here to review her last set of labs, and to have her Prilosec renewed. She reports that her current dosage is working well 2.weight gain: Ms Beckley is concerned over a 25lb weight gain in last year.        Review of Systems     Objective:   Physical Exam        Assessment & Plan:

## 2013-01-19 ENCOUNTER — Ambulatory Visit (HOSPITAL_BASED_OUTPATIENT_CLINIC_OR_DEPARTMENT_OTHER)
Admission: RE | Admit: 2013-01-19 | Discharge: 2013-01-19 | Disposition: A | Discharge status: Home / Self Care | Payer: Medicare Other | Source: Ambulatory Visit | Attending: Family Medicine | Admitting: Family Medicine

## 2013-01-19 ENCOUNTER — Other Ambulatory Visit: Payer: Self-pay | Admitting: *Deleted

## 2013-01-19 ENCOUNTER — Institutional Professional Consult (permissible substitution): Payer: BC Managed Care – HMO | Admitting: Internal Medicine

## 2013-01-19 DIAGNOSIS — Z86718 Personal history of other venous thrombosis and embolism: Secondary | ICD-10-CM

## 2013-01-19 DIAGNOSIS — M25569 Pain in unspecified knee: Secondary | ICD-10-CM | POA: Insufficient documentation

## 2013-01-20 ENCOUNTER — Ambulatory Visit: Payer: BC Managed Care – HMO | Admitting: Family Medicine

## 2013-02-08 DIAGNOSIS — J45909 Unspecified asthma, uncomplicated: Secondary | ICD-10-CM | POA: Insufficient documentation

## 2013-02-08 DIAGNOSIS — R635 Abnormal weight gain: Secondary | ICD-10-CM | POA: Insufficient documentation

## 2013-02-08 DIAGNOSIS — E876 Hypokalemia: Secondary | ICD-10-CM | POA: Insufficient documentation

## 2013-02-08 DIAGNOSIS — H8109 Meniere's disease, unspecified ear: Secondary | ICD-10-CM | POA: Insufficient documentation

## 2013-02-08 DIAGNOSIS — E039 Hypothyroidism, unspecified: Secondary | ICD-10-CM | POA: Insufficient documentation

## 2013-02-08 NOTE — Assessment & Plan Note (Signed)
She is doing okay on Maxzide so she'll stay on it.

## 2013-02-08 NOTE — Assessment & Plan Note (Signed)
Refilled her potassium.

## 2013-02-08 NOTE — Progress Notes (Signed)
Subjective:    Patient ID: Betty Atkinson, female    DOB: 08-11-1945, 67 y.o.   MRN: [id number]  HPI  Betty Atkinson is here today to go over her lab results, get medication refills and discuss the following conditions:  1)  Meniere's Disease:  She has been doing okay with this problem.  She needs to have her Maxzide refilled.   2)  GERD:  Her reflux is controlled on omeprazole.    3)  Hyperlipidemia:  She is doing fine on Crestor.    4)  Asthma:  Her symptoms are controlled on Singulair and Asmanex.  She needs a refill on her Singulair.    5)  Weight:  She is frustrated by her weight gain.  She is up about 25 lbs from last year and does not understand why.   6)  Hypothyroidism: She continues to feel fatigued.     Review of Systems  Constitutional: Positive for activity change, fatigue and unexpected weight change. Negative for appetite change.  HENT: Negative.   Eyes: Negative.   Respiratory: Negative for shortness of breath and wheezing.   Cardiovascular: Negative for chest pain, palpitations and leg swelling.  Gastrointestinal: Negative.   Genitourinary: Negative.   Skin: Negative.   Neurological: Positive for dizziness.  Psychiatric/Behavioral: Negative.     Past Medical History  Diagnosis Date  . Hyperlipemia   . Asthma   . Kidney calculi   . DVT (deep venous thrombosis)   . Meniere's disease   . Syncope   . Multinodular goiter   . Rosacea   . Osteoarthritis   . GERD (gastroesophageal reflux disease)   . Post-menopausal   . Allergic rhinitis   . Osteoporosis   . Vitamin D deficiency     Family History  Problem Relation Age of Onset  . Thyroid disease Father   . Heart disease Father   . Heart disease Mother   . Diabetes Mother   . Cancer Brother     fibrohisteofibroma  . Heart disease Brother   . Thyroid disease Maternal Grandmother     History   Social History Narrative   Marital Status: Married [name])    Children:  Daughter [name]) Son [name])   Pets:   None    Living Situation: Lives with spouse   Occupation: Futures trader    Education:  Engineer, agricultural    Alcohol Use:  None    Drug Use:  None    Diet:  Low Salt    Exercise:  None    Hobbies:             Current Outpatient Prescriptions on File Prior to Visit  Medication Sig Dispense Refill  . acetaminophen (TYLENOL) 500 MG tablet Take 1,000 mg by mouth daily as needed.        Marland Kitchen albuterol (PROAIR HFA) 108 (90 BASE) MCG/ACT inhaler Inhale 2 puffs into the lungs every 6 (six) hours as needed for wheezing.      Marland Kitchen aspirin 81 MG chewable tablet Chew 81 mg by mouth daily.        . Azelaic Acid (FINACEA) 15 % cream Apply topically daily. After skin is thoroughly washed and patted dry, gently but thoroughly massage a thin film of azelaic acid cream into the affected area twice daily, in the morning and evening.      . carboxymethylcellulose (REFRESH PLUS) 0.5 % SOLN Place 2 drops into both eyes 3 (three) times daily as needed. Seasonal allergies       .  diazepam (VALIUM) 5 MG tablet Take 5 mg by mouth every 6 (six) hours as needed. Manire's disease flare up       . levothyroxine (SYNTHROID, LEVOTHROID) 50 MCG tablet Take 50 mcg by mouth daily.        . Magnesium 500 MG CAPS Take 1 capsule by mouth daily.        . meclizine (ANTIVERT) 12.5 MG tablet Take 12.5 mg by mouth as needed for dizziness.      . mometasone (ASMANEX) 220 MCG/INH inhaler Inhale 1 puff into the lungs daily.        . mometasone (NASONEX) 50 MCG/ACT nasal spray Place 2 sprays into the nose at bedtime.        . potassium chloride (KLOR-CON) 10 MEQ CR tablet Take 10 mEq by mouth daily.        . Riboflavin (VITAMIN B-2 PO) Take 400 mg by mouth 2 (two) times daily.        Marland Kitchen triamterene-hydrochlorothiazide (DYAZIDE) 37.5-25 MG per capsule Take 1 capsule by mouth every morning.        . Vitamin D, Ergocalciferol, (DRISDOL) 50000 UNITS CAPS Take 50,000 Units by mouth every 7 (seven) days. Take on Sunday       . vitamin E 400 UNIT  capsule Take 400 Units by mouth daily.        Marland Kitchen ezetimibe (ZETIA) 10 MG tablet Take 10 mg by mouth daily.        . promethazine (PHENERGAN) 12.5 MG suppository Place 12.5 mg rectally every 6 (six) hours as needed for nausea.       No current facility-administered medications on file prior to visit.       Objective:   Physical Exam  Constitutional: She appears well-nourished. She is cooperative. No distress.  She has had noticeable weight gain.  She does appear to be very tired.    HENT:  Head: Normocephalic.  Eyes: No scleral icterus.  Neck: No thyromegaly present.  Cardiovascular: Normal rate, regular rhythm and normal heart sounds.   Pulmonary/Chest: Effort normal and breath sounds normal.  Abdominal: There is no tenderness.  Musculoskeletal: She exhibits no edema.  Neurological: She is alert.  Skin: Skin is warm and dry.  Psychiatric: She has a normal mood and affect. Her behavior is normal. Judgment and thought content normal.  She appears tired.            Assessment & Plan:

## 2013-02-08 NOTE — Assessment & Plan Note (Signed)
Refilled her Singulair.

## 2013-02-08 NOTE — Assessment & Plan Note (Signed)
She is to increase her levothyroxine from 50 mcg to 75 mcg to see is this will give her some increased energy and maybe help her with some weight loss.

## 2013-02-08 NOTE — Assessment & Plan Note (Addendum)
Betty Atkinson has had a significant amount of weight gain over the past year.  She has received steroid shots several times and has not been as active due to her foot injury.  She is to work harder on limiting her calories and is to increase her exercise.  She was encouraged to use My Fitness Pal to help her keep track of her calories.

## 2013-02-08 NOTE — Assessment & Plan Note (Signed)
Her lipid panel is perfect on Crestor so she will remain on it.

## 2013-02-08 NOTE — Assessment & Plan Note (Signed)
Refilled her omeprazole.

## 2013-02-16 ENCOUNTER — Ambulatory Visit (INDEPENDENT_AMBULATORY_CARE_PROVIDER_SITE_OTHER): Payer: Medicare Other | Admitting: Internal Medicine

## 2013-02-16 ENCOUNTER — Encounter: Payer: Self-pay | Admitting: Internal Medicine

## 2013-02-16 VITALS — BP 121/77 | HR 118 | Ht 61.0 in | Wt 157.0 lb

## 2013-02-16 DIAGNOSIS — Z86718 Personal history of other venous thrombosis and embolism: Secondary | ICD-10-CM

## 2013-02-16 DIAGNOSIS — R5381 Other malaise: Secondary | ICD-10-CM

## 2013-02-16 DIAGNOSIS — Z01818 Encounter for other preprocedural examination: Secondary | ICD-10-CM

## 2013-02-16 DIAGNOSIS — I498 Other specified cardiac arrhythmias: Secondary | ICD-10-CM

## 2013-02-16 DIAGNOSIS — H8101 Meniere's disease, right ear: Secondary | ICD-10-CM

## 2013-02-16 DIAGNOSIS — Z01810 Encounter for preprocedural cardiovascular examination: Secondary | ICD-10-CM | POA: Insufficient documentation

## 2013-02-16 DIAGNOSIS — R0602 Shortness of breath: Secondary | ICD-10-CM | POA: Insufficient documentation

## 2013-02-16 DIAGNOSIS — R5383 Other fatigue: Secondary | ICD-10-CM

## 2013-02-16 DIAGNOSIS — R000 Tachycardia, unspecified: Secondary | ICD-10-CM

## 2013-02-16 DIAGNOSIS — R55 Syncope and collapse: Secondary | ICD-10-CM | POA: Insufficient documentation

## 2013-02-16 DIAGNOSIS — H8109 Meniere's disease, unspecified ear: Secondary | ICD-10-CM

## 2013-02-16 NOTE — Progress Notes (Signed)
Primary Care Physician: Betty Jubilee, MD Referring Physician:  Dr Betty Atkinson ([facility])   Betty Atkinson is a 67 y.o. female with a h/o meniere's disease who presents for preoperative clearance.  She has had meniere's disease for 8 years.  She reports symptoms of dizziness, unsteadiness, nausea, and vomiting. She also has poor hearing in her R ear.  She is being evaluated by Dr Betty Atkinson with consideration for labyrinthectomy.  She is referred today for preoperative clearance.    She reports occasional palpitations which she describes as an abrupt flutter lasting several seconds with associated SOB.  She reports that this is infrequent. She had an episode a year ago when she developed gradually increasing tachycardia with symptoms of presyncope.  She reports having syncope twice upon abrupt standing.  Once episode occurred two years ago and the other was a year ago.  She reports that with the first episode, she was standing and talking to her spouse when she had sudden loss of consciousness.  The second episode, she stood up quickliy and passed out.  With both episodes, she returned to normal consciousness very quickly without any residual symptoms.  She was previously treated with higher doses of maxzide for her menieres disease.  Her dose was decreased due to prerenal azotemia/ renal failure (per pt).   She reports that she is not physically very active.  She is able to do her housework but develops fatigue/SOB and has to sit frequently.  She reports that she cannot vacuum due to unsteadiness with menieres and also due to chronic back pain.  She has chronic R leg swelling which is felt to be due to venous insufficiency related to her prior R leg DVT.  She has been instructed to wear support hose.  Today, she denies symptoms of chest pain,  orthopnea, PND, further syncope, or neurologic sequela. The patient is tolerating medications without difficulties and is otherwise without complaint today.    Past Medical History  Diagnosis Date  . Hyperlipemia   . Asthma   . Kidney calculi     recurrent, lithotripsy  . DVT (deep venous thrombosis) 2010    R leg, treated with coumadin x 6 months  . Meniere's disease   . Postural dizziness     postural syncope x 2  . Multinodular goiter   . Rosacea   . Osteoarthritis   . GERD (gastroesophageal reflux disease)   . Post-menopausal   . Allergic rhinitis   . Osteoporosis   . Vitamin D deficiency   . Hearing disorder     R ear deafness  . Venous insufficiency of leg     post thrombotic syndrome   Past Surgical History  Procedure Laterality Date  . Tonsillectomy    . Cardiac catheterization  2005    no CAD by cath at [facility]  . Lithotripsy    . Total abdominal hysterectomy w/ bilateral salpingoophorectomy  1990    Current Outpatient Prescriptions  Medication Sig Dispense Refill  . albuterol (PROVENTIL HFA;VENTOLIN HFA) 108 (90 BASE) MCG/ACT inhaler USE AS NEEDED      . Azelaic Acid (FINACEA) 15 % cream Apply topically daily. After skin is thoroughly washed and patted dry, gently but thoroughly massage a thin film of azelaic acid cream into the affected area twice daily, in the morning and evening.      . cetirizine (ZYRTEC) 10 MG tablet Take 10 mg by mouth daily.      . diazepam (VALIUM) 5 MG tablet  Take 5 mg by mouth every 6 (six) hours as needed for anxiety.      Marland Kitchen levothyroxine (SYNTHROID) 50 MCG tablet Take 50 mcg by mouth daily before breakfast.      . Magnesium 500 MG CAPS Take 1 capsule by mouth daily.        . mometasone (ASMANEX 14 METERED DOSES) 220 MCG/INH inhaler USE I TO 2 PUFF DAILY AT BED TIME      . mometasone (NASONEX) 50 MCG/ACT nasal spray Place 2 sprays into the nose at bedtime.        . montelukast (SINGULAIR) 10 MG tablet Take 10 mg by mouth at bedtime.      . Omeprazole Magnesium (PRILOSEC OTC PO) Take by mouth daily.      . potassium chloride (K-DUR) 10 MEQ tablet Take 1 tablet (10 mEq total)  by mouth 2 (two) times daily.  180 tablet  3  . promethazine (PHENERGAN) 12.5 MG suppository Place 12.5 mg rectally every 6 (six) hours as needed for nausea.      . Riboflavin (VITAMIN B-2 PO) TAKE  200MG  4 TIMES DAILY      . rosuvastatin (CRESTOR) 40 MG tablet Take 1/2 - 1 tab daily  30 tablet  2  . triamterene-hydrochlorothiazide (MAXZIDE-25) 37.5-25 MG per tablet TAKE  1/2 TABLET DAILY      . Vitamin D, Ergocalciferol, (DRISDOL) 50000 UNITS CAPS Take 50,000 Units by mouth every 7 (seven) days. Take on Sunday       . vitamin E 400 UNIT capsule Take 400 Units by mouth daily.        Marland Kitchen aspirin 81 MG chewable tablet Chew 81 mg by mouth daily.         No current facility-administered medications for this visit.    Allergies  Allergen Reactions  . Methotrexate Derivatives Anaphylaxis    Cough, liver failure  . Lyrica (Pregabalin)     Slurred speech, blurred vision, confusion, incohert  . Other     PHENEGAN TABLET : MAKE PATIENT LUPPY PER PT., THUS CONFUSE  . Prednisone Swelling    Hives on face  . Sulfa Antibiotics Diarrhea    indigestion    History   Social History  . Marital Status: Married    Spouse Name: MICHAEL    Number of Children: 2  . Years of Education: 12   Occupational History  . Homemaker    Social History Main Topics  . Smoking status: Never Smoker   . Smokeless tobacco: Never Used  . Alcohol Use: No  . Drug Use: No  . Sexually Active: Yes -- Female partner(s)   Other Topics Concern  . 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 Thyroid  disease Father   . Heart disease Father   . Heart disease Mother   . Diabetes Mother   . Cancer  Brother     fibrohisteofibroma  . Heart disease Brother   . Thyroid disease Maternal Grandmother     ROS- All systems are reviewed and negative except as per the HPI above  Physical Exam: Filed Vitals:   02/16/13 1543  BP: 140/72  Pulse: 122  Height: 5\' 1"  (1.549 m)  Weight: 157 lb (71.215 kg)    GEN- The patient is well appearing, alert and oriented x 3 today.   Head- normocephalic, atraumatic Eyes-  Sclera clear, conjunctiva pink Ears- decreased R sided hearing Oropharynx- clear Neck- supple, no JVP Lymph- no cervical lymphadenopathy Lungs- Clear to ausculation bilaterally, normal work of breathing Heart- tachycardic regular rhythm, no murmurs, rubs or gallops, PMI not laterally displaced GI- soft, NT, ND, + BS Extremities- no clubbing, cyanosis, or edema MS- no significant deformity or atrophy Skin- no rash or lesion Psych- euthymic mood, full affect Neuro- strength and sensation are intact  Records from Dr Betty Atkinson' office are reviewed Records from [facility] cardiology are reviewed EKG today reveals sinus tachycardia 122 bpm, LVH, LAD  Assessment and Plan:   1. Sinus tachycardia- likely due to mild dehydration with triamterene/ hctz.  She is not orthostatic today. She denies symptoms related to her tachycardia. I will check echo, CBC, BMET, and TSH to evaluate for other causes for her tachycardia. Adequate hydration is encouarged  2. Prior syncope- postural in nature and in the setting of triamterene/ hctz, likely due to dehydration or mild dysautonomia.  I think that this is benign and can be managed with lifestyle modification.  Echo to evaluate for any structural abnormality  3. Venous insufficiency/ post thrombotic syndrome Compliance with compression stockings encouraged  4. Preoperative clearance She is not very active.  Though she had a normal cath in 2005,  she has poor exercise tolerance at this time.  I think that further CV risk stratification with myoview is therefore necessary.  She is unable to walk on a treadmill due to back pain.  I will therefore order a lexiscan myoview. If echo and myoview are low risk, then she may proceed to surgery without further CV testing and I will be available as needed going forward.

## 2013-02-16 NOTE — Patient Instructions (Addendum)
Your physician recommends that you schedule a follow-up appointment as needed   Your physician has requested that you have a lexiscan myoview. For further information please visit [email or web address] Please follow instruction sheet, as given.---if stress test normal may proceed with surgery  Your physician has requested that you have an echocardiogram. Echocardiography is a painless test that uses sound waves to create images of your heart. It provides your doctor with information about the size and shape of your heart and how well your heart's chambers and valves are working. This procedure takes approximately one hour. There are no restrictions for this procedure.    Your physician recommends that you return for lab work today: TSH/BMP/CBC

## 2013-02-17 LAB — CBC WITH DIFFERENTIAL/PLATELET
Basophils Absolute: 0 K/uL (ref 0.0–0.1)
Basophils Relative: 0.4 % (ref 0.0–3.0)
Eosinophils Absolute: 0.1 K/uL (ref 0.0–0.7)
Eosinophils Relative: 1.2 % (ref 0.0–5.0)
HCT: 42.6 % (ref 36.0–46.0)
Hemoglobin: 14 g/dL (ref 12.0–15.0)
Lymphocytes Relative: 31.7 % (ref 12.0–46.0)
Lymphs Abs: 2.9 K/uL (ref 0.7–4.0)
MCHC: 32.9 g/dL (ref 30.0–36.0)
MCV: 97.4 fl (ref 78.0–100.0)
Monocytes Absolute: 0.7 K/uL (ref 0.1–1.0)
Monocytes Relative: 7.8 % (ref 3.0–12.0)
Neutro Abs: 5.3 K/uL (ref 1.4–7.7)
Neutrophils Relative %: 58.9 % (ref 43.0–77.0)
Platelets: 248 K/uL (ref 150.0–400.0)
RBC: 4.37 Mil/uL (ref 3.87–5.11)
RDW: 13.5 % (ref 11.5–14.6)
WBC: 9 K/uL (ref 4.5–10.5)

## 2013-02-17 LAB — BASIC METABOLIC PANEL
Calcium: 10 mg/dL (ref 8.4–10.5)
Creatinine, Ser: 1.6 mg/dL — ABNORMAL HIGH (ref 0.4–1.2)
GFR: 33.84 mL/min — ABNORMAL LOW (ref >60.00)
Glucose, Bld: 116 mg/dL — ABNORMAL HIGH (ref 70–99)
Sodium: 143 mEq/L (ref 135–145)

## 2013-02-17 LAB — TSH: TSH: 0.43 u[IU]/mL (ref 0.35–5.50)

## 2013-02-20 ENCOUNTER — Institutional Professional Consult (permissible substitution): Payer: BC Managed Care – HMO | Admitting: Internal Medicine

## 2013-03-02 ENCOUNTER — Other Ambulatory Visit: Payer: Medicare Other

## 2013-03-02 LAB — TSH: TSH: 0.64 u[IU]/mL (ref 0.350–4.500)

## 2013-03-02 LAB — T3, FREE: T3, Free: 2.7 pg/mL (ref 2.3–4.2)

## 2013-03-02 LAB — T4, FREE: Free T4: 1.5 ng/dL (ref 0.80–1.80)

## 2013-03-03 ENCOUNTER — Other Ambulatory Visit: Payer: Medicare Other

## 2013-03-04 ENCOUNTER — Ambulatory Visit (HOSPITAL_COMMUNITY): Payer: Medicare Other | Attending: Cardiology | Admitting: Radiology

## 2013-03-04 VITALS — Ht 61.0 in | Wt 154.0 lb

## 2013-03-04 DIAGNOSIS — R000 Tachycardia, unspecified: Secondary | ICD-10-CM

## 2013-03-04 DIAGNOSIS — R0989 Other specified symptoms and signs involving the circulatory and respiratory systems: Secondary | ICD-10-CM | POA: Insufficient documentation

## 2013-03-04 DIAGNOSIS — R5381 Other malaise: Secondary | ICD-10-CM | POA: Insufficient documentation

## 2013-03-04 DIAGNOSIS — R079 Chest pain, unspecified: Secondary | ICD-10-CM

## 2013-03-04 DIAGNOSIS — R0602 Shortness of breath: Secondary | ICD-10-CM | POA: Insufficient documentation

## 2013-03-04 DIAGNOSIS — R0609 Other forms of dyspnea: Secondary | ICD-10-CM | POA: Insufficient documentation

## 2013-03-04 DIAGNOSIS — R110 Nausea: Secondary | ICD-10-CM | POA: Insufficient documentation

## 2013-03-04 DIAGNOSIS — R002 Palpitations: Secondary | ICD-10-CM | POA: Insufficient documentation

## 2013-03-04 DIAGNOSIS — Z01818 Encounter for other preprocedural examination: Secondary | ICD-10-CM

## 2013-03-04 DIAGNOSIS — R42 Dizziness and giddiness: Secondary | ICD-10-CM | POA: Insufficient documentation

## 2013-03-04 DIAGNOSIS — R5383 Other fatigue: Secondary | ICD-10-CM

## 2013-03-04 MED ORDER — TECHNETIUM TC 99M SESTAMIBI GENERIC - CARDIOLITE
11.0000 | Freq: Once | INTRAVENOUS | Status: AC | PRN
  Administered 2013-03-04: 11 via INTRAVENOUS

## 2013-03-04 MED ORDER — TECHNETIUM TC 99M SESTAMIBI GENERIC - CARDIOLITE
33.0000 | Freq: Once | INTRAVENOUS | Status: AC | PRN
  Administered 2013-03-04: 33 via INTRAVENOUS

## 2013-03-04 MED ORDER — REGADENOSON 0.4 MG/5ML IV SOLN
0.4000 mg | Freq: Once | INTRAVENOUS | Status: AC
  Administered 2013-03-04: 0.4 mg via INTRAVENOUS

## 2013-03-04 MED ORDER — AMINOPHYLLINE 25 MG/ML IV SOLN
75.0000 mg | Freq: Once | INTRAVENOUS | Status: AC
  Administered 2013-03-04: 75 mg via INTRAVENOUS

## 2013-03-04 NOTE — Progress Notes (Signed)
[facility] SITE 3 NUCLEAR MED [address] [place], [place] [address] [phone]    Cardiology Nuclear Med Study  Betty Atkinson is a 67 y.o. female     MRN : [id number]     DOB: Dec 05, 1945  Procedure Date: 03/04/2013  Nuclear Med Background Indication for Stress Test:  Evaluation for Ischemia and Surgical Clearance Ear surgery- Dr. Ermalinda Barrios History:  2005 Catheterization in [place]- Normal Cardiac Risk Factors: Family History - CAD and Lipids  Symptoms:  Dizziness, DOE, Fatigue, Fatigue with Exertion, Nausea, Palpitations, Rapid HR, SOB and Vomiting   Nuclear Pre-Procedure Caffeine/Decaff Intake:  None NPO After: 9:00pm   Lungs:  clear O2 Sat: 96% on room air. IV 0.9% NS with Angio Cath:  22g  IV Site: R Hand  IV Started by:  Bonnita Levan, RN  Chest Size (in):  36 Cup Size: C  Height: 5\' 1"  (1.549 m)  Weight:  154 lb (69.854 kg)  BMI:  Body mass index is 29.11 kg/(m^2). Tech Comments:  N/A    Nuclear Med Study 1 or 2 day study: 1 day  Stress Test Type:  Lexiscan  Reading MD: Willa Rough, MD  Order Authorizing Provider:  Hillis Range, MD  Resting Radionuclide: Technetium 90m Sestamibi  Resting Radionuclide Dose: 11.0 mCi   Stress Radionuclide:  Technetium 22m Sestamibi  Stress Radionuclide Dose: 33.0 mCi           Stress Protocol Rest HR: 82 Stress HR: 121  Rest BP: 140/78 Stress BP: 139/78  Exercise Time (min): n/a METS: n/a   Predicted Max HR: 153 bpm % Max HR: 79.08 bpm Rate Pressure Product: 24401   Dose of Adenosine (mg):  n/a Dose of Lexiscan: 0.4 mg  Dose of Atropine (mg): n/a Dose of Dobutamine: n/a mcg/kg/min (at max HR)  Stress Test Technologist: Bonnita Levan, RN  Nuclear Technologist:  Leonia Corona, RT-N     Rest Procedure:  Myocardial perfusion imaging was performed at rest 45 minutes following the intravenous administration of Technetium 28m Sestamibi. Rest ECG: NSR - Normal EKG  Stress Procedure:  The patient received IV Lexiscan 0.4  mg over 15-seconds.  Technetium 66m Sestamibi injected at 30-seconds.  Quantitative spect images were obtained after a 45 minute delay. Stress ECG: No significant change from baseline ECG  QPS Raw Data Images:  Normal; no motion artifact; normal heart/lung ratio. Stress Images:  Normal homogeneous uptake in all areas of the myocardium. Rest Images:  Normal homogeneous uptake in all areas of the myocardium. Subtraction (SDS):  No evidence of ischemia. Transient Ischemic Dilatation (Normal <1.22):  n/a Lung/Heart Ratio (Normal <0.45):  0.49  Quantitative Gated Spect Images QGS EDV:  23 ml QGS ESV:  1 ml  Impression Exercise Capacity:  Lexiscan with no exercise. BP Response:  Normal blood pressure response. Clinical Symptoms:  Dyspnea ECG Impression:  No significant ST segment change suggestive of ischemia. Comparison with Prior Nuclear Study: No images to compare  Overall Impression:  Normal stress nuclear study. There is no definite scar or ischemia. This is a low risk scan.  LV Ejection Fraction: 96%.  LV Wall Motion:  LV wall motion is normal. The computer estimate of the ejection fraction is abnormally high because the ventricle is very small. The ejection fraction is probably in the 70% range.  Willa Rough, MD

## 2013-03-06 ENCOUNTER — Ambulatory Visit: Payer: Medicare Other | Admitting: Family Medicine

## 2013-03-06 ENCOUNTER — Telehealth: Payer: Self-pay | Admitting: Internal Medicine

## 2013-03-06 ENCOUNTER — Other Ambulatory Visit: Payer: Self-pay | Admitting: Family Medicine

## 2013-03-06 ENCOUNTER — Encounter: Payer: Self-pay | Admitting: Internal Medicine

## 2013-03-06 ENCOUNTER — Ambulatory Visit (INDEPENDENT_AMBULATORY_CARE_PROVIDER_SITE_OTHER): Payer: Medicare Other | Admitting: Internal Medicine

## 2013-03-06 VITALS — BP 122/80 | HR 84 | Ht 61.0 in | Wt 155.0 lb

## 2013-03-06 DIAGNOSIS — L03113 Cellulitis of right upper limb: Secondary | ICD-10-CM | POA: Insufficient documentation

## 2013-03-06 DIAGNOSIS — IMO0002 Reserved for concepts with insufficient information to code with codable children: Secondary | ICD-10-CM

## 2013-03-06 MED ORDER — CEPHALEXIN 500 MG PO CAPS: 500.0000 mg | ORAL_CAPSULE | Freq: Three times a day (TID) | ORAL | Status: DC

## 2013-03-06 NOTE — Telephone Encounter (Signed)
Pt has an appt on 03/12/13. PG

## 2013-03-06 NOTE — Assessment & Plan Note (Signed)
The patient's arm is not definitive for cellulitis but concerning.  We will treat her with antibiotics, elevation, and warm compresses. She is instructed to call as if she has worsening fevers or chills.

## 2013-03-06 NOTE — Patient Instructions (Signed)
Warm compresses and elevate the arm three times daily  Your physician has recommended you make the following change in your medication:  1) Keflex 500mg  three times daily for 5 days

## 2013-03-06 NOTE — Telephone Encounter (Signed)
New problem   Pt had stress test 03/04/13 and they injected medicine in her arm and the area is now swollen above where needle was and have a red streak. Please call pt

## 2013-03-06 NOTE — Progress Notes (Signed)
CARDIOLOGY OFFICE NOTE  Patient ID: Betty Atkinson MRN: [id number], DOB/AGE: 01/11/1946   Date of Visit: 03/06/2013  Primary Physician: Birdena Jubilee, MD Primary Cardiologist: New to Artis Delay, MD Reason for Visit: Rash at IV site  History of Present Illness  Betty Atkinson is a 67 y.o. female with Meniere's disease who present earlier this month for preoperative clearance. She has had Meniere's disease for 8 years. She reports symptoms of dizziness, unsteadiness, nausea, and vomiting. She also has poor hearing on the right. She is being evaluated by Dr Dorma Russell with consideration for labyrinthectomy. She was referred to Dr. Johney Frame for preoperative clearance. A stress test was ordered. This was performed on 03/04/2013. Today she noticed increased redness, tenderness and swelling at the IV site and presents for evaluation. She denies fevers, chills, or drainage from her skin.  Past Medical History Past Medical History  Diagnosis Date  . Hyperlipemia   . Asthma   . Kidney calculi     recurrent, lithotripsy  . DVT (deep venous thrombosis) 2010    R leg, treated with coumadin x 6 months  . Meniere's disease   . Postural dizziness     postural syncope x 2  . Multinodular goiter   . Rosacea   . Osteoarthritis   . GERD (gastroesophageal reflux disease)   . Post-menopausal   . Allergic rhinitis   . Osteoporosis   . Vitamin D deficiency   . Hearing disorder     R ear deafness  . Venous insufficiency of leg     post thrombotic syndrome    Past Surgical History Past Surgical History  Procedure Laterality Date  . Tonsillectomy    . Cardiac catheterization  2005    no CAD by cath at [facility]  . Lithotripsy    . Total abdominal hysterectomy w/ bilateral salpingoophorectomy  1990    Allergies/Intolerances Allergies  Allergen Reactions  . Methotrexate Derivatives Anaphylaxis    Cough, liver failure  . Lyrica [Pregabalin]     Slurred speech, blurred vision, confusion,  incohert  . Other     PHENEGAN TABLET : MAKE PATIENT LUPPY PER PT., THUS CONFUSE  . Prednisone Swelling    Hives on face  . Sulfa Antibiotics Diarrhea    indigestion   Current Home Medications Current Outpatient Prescriptions  Medication Sig Dispense Refill  . albuterol (PROVENTIL HFA;VENTOLIN HFA) 108 (90 BASE) MCG/ACT inhaler USE AS NEEDED      . aspirin 81 MG chewable tablet Chew 81 mg by mouth daily.        . Azelaic Acid (FINACEA) 15 % cream Apply topically daily. After skin is thoroughly washed and patted dry, gently but thoroughly massage a thin film of azelaic acid cream into the affected area twice daily, in the morning and evening.      . cetirizine (ZYRTEC) 10 MG tablet Take 10 mg by mouth daily.      . diazepam (VALIUM) 5 MG tablet Take 5 mg by mouth every 6 (six) hours as needed for anxiety.      Marland Kitchen levothyroxine (SYNTHROID, LEVOTHROID) 50 MCG tablet TAKE 1 PILL BY MOUTH IN A.M. X 3 MONTHS (90D)  30 tablet  0  . Magnesium 500 MG CAPS Take 1 capsule by mouth daily.        . mometasone (ASMANEX 14 METERED DOSES) 220 MCG/INH inhaler USE I TO 2 PUFF DAILY AT BED TIME      . mometasone (NASONEX) 50 MCG/ACT nasal spray Place  2 sprays into the nose at bedtime.        . montelukast (SINGULAIR) 10 MG tablet Take 10 mg by mouth at bedtime.      . Omeprazole Magnesium (PRILOSEC OTC PO) Take by mouth daily.      . potassium chloride (K-DUR) 10 MEQ tablet Take 1 tablet (10 mEq total) by mouth 2 (two) times daily.  180 tablet  3  . promethazine (PHENERGAN) 12.5 MG suppository Place 12.5 mg rectally every 6 (six) hours as needed for nausea.      . Riboflavin (VITAMIN B-2 PO) TAKE  200MG  4 TIMES DAILY      . rosuvastatin (CRESTOR) 40 MG tablet Take 1/2 - 1 tab daily  30 tablet  2  . triamterene-hydrochlorothiazide (MAXZIDE-25) 37.5-25 MG per tablet TAKE  1/2 TABLET DAILY      . Vitamin D, Ergocalciferol, (DRISDOL) 50000 UNITS CAPS Take 50,000 Units by mouth every 7 (seven) days. Take on Sunday        . vitamin E 400 UNIT capsule Take 400 Units by mouth daily.        . cephALEXin (KEFLEX) 500 MG capsule Take 1 capsule (500 mg total) by mouth 3 (three) times daily.  15 capsule  0   No current facility-administered medications for this visit.   Social History Social History  . Marital Status: Married   Occupational History  . Homemaker    Social History Main Topics  . Smoking status Never Smoker   . Smokeless tobacco: Never Used  . Alcohol Use: No  . 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  Physical Exam Vitals: Blood pressure 122/80, pulse 84, height 5\' 1"  (1.549 m), weight 155 lb (70.308 kg).  General: Well developed, well appearing 67 y.o. female in no acute distress. HEENT: Normocephalic, atraumatic. EOMs intact. Sclera nonicteric. Oropharynx clear.  Neck: Supple without bruits. No JVD. Lungs: Respirations regular and unlabored, CTA bilaterally. No wheezes, rales or rhonchi. Heart: RRR. S1, S2 present. No murmurs, rub, S3 or S4. Abdomen: Soft, non-tender, non-distended. BS present x 4  quadrants. No hepatosplenomegaly.  Extremities: No clubbing, cyanosis or edema. DP/PT/Radials 2+ and equal bilaterally. Psych: Normal affect. Neuro: Alert and oriented X 3. Moves all extremities spontaneously. Skin: IV site with minimal erythema and minimal tenderness to palpation.   Diagnostics Lexiscan Myoview stress test Overall Impression: Normal stress nuclear study. There is no definite scar or ischemia. This is a low risk scan.  LV Ejection Fraction:. LV Wall Motion: LV wall motion is normal. The computer estimate of the ejection fraction is abnormally  high because the ventricle is very small. The ejection fraction is probably in the 70% range.  Read by Willa Rough, MD  Assessment and Plan 1. Inflamed IV site - no definitive findings of cellulitis however, there is tenderness and some swelling. - instructed patient to elevate the arm and apply warm compresses - will start empiric antibiotics with Keflex 500 mg BID x 5 days  This plan of care was formulated with Dr. Ladona Ridgel who was in to interview and examine the patient. Limmie Patricia, PA-C 03/06/2013, 4:03 PM  Cardiology attending  Patient seen and examined. I've reviewed and noted above and made a minutes where indicated. The patient does not have definitive cellulitis but I am concerned about the tenderness and swelling at the IV insertion site. She could have phlebitis. I've recommended elevation of the arm, warm compresses, and antibiotic therapy as outlined above. She will call if she develops fevers or chills or worsening erythema or swelling.  Lewayne Bunting, M.D.

## 2013-03-06 NOTE — Telephone Encounter (Signed)
Dr Ladona Ridgel is going to take a look at the site today

## 2013-03-07 LAB — T3, REVERSE: T3, Reverse: 30 ng/dL — ABNORMAL HIGH (ref 8–25)

## 2013-03-10 ENCOUNTER — Ambulatory Visit: Payer: Medicare Other | Admitting: Family Medicine

## 2013-03-10 ENCOUNTER — Ambulatory Visit (HOSPITAL_COMMUNITY): Payer: Medicare Other | Attending: Internal Medicine | Admitting: Radiology

## 2013-03-10 DIAGNOSIS — R5383 Other fatigue: Secondary | ICD-10-CM

## 2013-03-10 DIAGNOSIS — E785 Hyperlipidemia, unspecified: Secondary | ICD-10-CM | POA: Insufficient documentation

## 2013-03-10 DIAGNOSIS — R0602 Shortness of breath: Secondary | ICD-10-CM | POA: Insufficient documentation

## 2013-03-10 DIAGNOSIS — Z01810 Encounter for preprocedural cardiovascular examination: Secondary | ICD-10-CM

## 2013-03-10 DIAGNOSIS — R0609 Other forms of dyspnea: Secondary | ICD-10-CM | POA: Insufficient documentation

## 2013-03-10 DIAGNOSIS — Z01818 Encounter for other preprocedural examination: Secondary | ICD-10-CM

## 2013-03-10 DIAGNOSIS — R5381 Other malaise: Secondary | ICD-10-CM | POA: Insufficient documentation

## 2013-03-10 DIAGNOSIS — I498 Other specified cardiac arrhythmias: Secondary | ICD-10-CM | POA: Insufficient documentation

## 2013-03-10 DIAGNOSIS — R55 Syncope and collapse: Secondary | ICD-10-CM | POA: Insufficient documentation

## 2013-03-10 DIAGNOSIS — Z86718 Personal history of other venous thrombosis and embolism: Secondary | ICD-10-CM | POA: Insufficient documentation

## 2013-03-10 DIAGNOSIS — R0989 Other specified symptoms and signs involving the circulatory and respiratory systems: Secondary | ICD-10-CM | POA: Insufficient documentation

## 2013-03-10 DIAGNOSIS — R000 Tachycardia, unspecified: Secondary | ICD-10-CM

## 2013-03-10 NOTE — Progress Notes (Signed)
Echocardiogram performed.  

## 2013-03-12 ENCOUNTER — Ambulatory Visit: Payer: Medicare Other | Admitting: Family Medicine

## 2013-03-12 ENCOUNTER — Ambulatory Visit (INDEPENDENT_AMBULATORY_CARE_PROVIDER_SITE_OTHER): Payer: Medicare Other | Admitting: Family Medicine

## 2013-03-12 VITALS — BP 116/80 | HR 116 | Wt 154.0 lb

## 2013-03-12 DIAGNOSIS — M1990 Unspecified osteoarthritis, unspecified site: Secondary | ICD-10-CM

## 2013-03-12 DIAGNOSIS — H8109 Meniere's disease, unspecified ear: Secondary | ICD-10-CM

## 2013-03-12 DIAGNOSIS — E876 Hypokalemia: Secondary | ICD-10-CM

## 2013-03-12 DIAGNOSIS — E785 Hyperlipidemia, unspecified: Secondary | ICD-10-CM

## 2013-03-12 DIAGNOSIS — Z23 Encounter for immunization: Secondary | ICD-10-CM

## 2013-03-12 DIAGNOSIS — E039 Hypothyroidism, unspecified: Secondary | ICD-10-CM

## 2013-03-12 DIAGNOSIS — H8101 Meniere's disease, right ear: Secondary | ICD-10-CM

## 2013-03-12 MED ORDER — ROSUVASTATIN CALCIUM 40 MG PO TABS: ORAL_TABLET | ORAL | Status: DC

## 2013-03-12 MED ORDER — POTASSIUM CHLORIDE ER 10 MEQ PO TBCR: 10.0000 meq | EXTENDED_RELEASE_TABLET | Freq: Two times a day (BID) | ORAL | Status: DC

## 2013-03-12 MED ORDER — DICLOFENAC SODIUM 1 % TD GEL: 4.0000 g | Freq: Four times a day (QID) | TRANSDERMAL | Status: AC

## 2013-03-12 MED ORDER — LEVOTHYROXINE SODIUM 50 MCG PO TABS: 50.0000 ug | ORAL_TABLET | Freq: Every day | ORAL | Status: DC

## 2013-03-12 MED ORDER — TRIAMTERENE-HCTZ 37.5-25 MG PO TABS: 1.0000 | ORAL_TABLET | Freq: Every day | ORAL | Status: AC

## 2013-03-12 NOTE — Progress Notes (Signed)
Subjective:    Patient ID: Betty Atkinson, female    DOB: 12-Jul-1945, 67 y.o.   MRN: [id number]  HPI  Betty Atkinson is here today to go over her most recent lab results, to discuss the conditions listed below and to get some of her medications refilled.   1)  Hypothyroidism:  She tried taking 1.5 pills of her Levothyroxine 50 mcg but felt too jittery so she cut back to 1 pill.  She continues to experience fatigue, weight gain and she has noted some knots in her neck that has concerned her.   2)  Hypertension:  Her blood pressure continues to be controlled on triam/HCTZ 37.5-25.  She needs a refill on it.    3)  Hyperlipidemia:  She is doing well with her Crestor 40 mg (1/2 pill).  She needs a refill on it.    Review of Systems  Constitutional: Positive for fatigue and unexpected weight change.  HENT: Negative.        Knots in her neck  Eyes: Negative.   Respiratory: Negative.   Cardiovascular: Negative.   Gastrointestinal: Negative.   Endocrine: Positive for heat intolerance.       Night sweats  Musculoskeletal: Negative.   Skin: Negative.   Allergic/Immunologic: Negative.   Neurological: Negative.   Hematological: Negative.   Psychiatric/Behavioral: Negative.     Past Medical History  Diagnosis Date  . Hyperlipemia   . Asthma   . Kidney calculi     recurrent, lithotripsy  . DVT (deep venous thrombosis) 2010    R leg, treated with coumadin x 6 months  . Meniere's disease   . Postural dizziness     postural syncope x 2  . Multinodular goiter   . Rosacea   . Osteoarthritis   . GERD (gastroesophageal reflux disease)   . Post-menopausal   . Allergic rhinitis   . Osteoporosis   . Vitamin D deficiency   . Hearing disorder     R ear deafness  . Venous insufficiency of leg     post thrombotic syndrome    Family History  Problem Relation Age of Onset  . Thyroid disease Father   . Heart disease Father   . Heart disease Mother   . Diabetes Mother   . Cancer Brother      fibrohisteofibroma  . Heart disease Brother   . 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 Constitutional: She is oriented to person, place, and time.  Eyes: Conjunctivae are normal. No scleral icterus.  Neck: Neck supple. No thyromegaly present.  Cardiovascular: Normal rate, regular rhythm and normal heart sounds.   Pulmonary/Chest: Effort normal and breath sounds normal.  Musculoskeletal: She exhibits no edema and no tenderness.  Lymphadenopathy:    She has no cervical adenopathy.  Neurological: She is alert and oriented  to person, place, and time.  Skin: Skin is warm and dry.  Psychiatric: She has a normal mood and affect. Her behavior is normal. Judgment and thought content normal.          Assessment & Plan:

## 2013-03-13 ENCOUNTER — Ambulatory Visit: Payer: Medicare Other | Admitting: Family Medicine

## 2013-04-05 ENCOUNTER — Other Ambulatory Visit: Payer: Self-pay | Admitting: Family Medicine

## 2013-04-13 ENCOUNTER — Encounter: Payer: Self-pay | Admitting: Family Medicine

## 2013-05-16 DIAGNOSIS — M1990 Unspecified osteoarthritis, unspecified site: Secondary | ICD-10-CM | POA: Insufficient documentation

## 2013-05-16 DIAGNOSIS — Z23 Encounter for immunization: Secondary | ICD-10-CM | POA: Insufficient documentation

## 2013-05-16 NOTE — Assessment & Plan Note (Signed)
Refilled her potassium.

## 2013-05-16 NOTE — Assessment & Plan Note (Signed)
She was given a prescription for Voltaren Gel.   

## 2013-05-16 NOTE — Assessment & Plan Note (Signed)
She is to remain on levothyroxine.

## 2013-05-16 NOTE — Assessment & Plan Note (Signed)
Refilled her Maxzide.

## 2013-05-16 NOTE — Assessment & Plan Note (Signed)
Refilled her Crestor.   

## 2013-10-20 ENCOUNTER — Other Ambulatory Visit: Payer: Self-pay | Admitting: Family Medicine

## 2014-03-01 ENCOUNTER — Other Ambulatory Visit: Payer: Self-pay | Admitting: Family Medicine

## 2014-03-03 ENCOUNTER — Other Ambulatory Visit: Payer: Self-pay | Admitting: Family Medicine

## 2014-05-10 ENCOUNTER — Encounter: Payer: Self-pay | Admitting: Family Medicine

## 2014-12-19 ENCOUNTER — Emergency Department (HOSPITAL_BASED_OUTPATIENT_CLINIC_OR_DEPARTMENT_OTHER)
Admission: EM | Admit: 2014-12-19 | Discharge: 2014-12-19 | Disposition: A | Discharge status: Other Acute Inpatient Hospital | Payer: Medicare Other | Attending: Emergency Medicine | Admitting: Emergency Medicine

## 2014-12-19 ENCOUNTER — Encounter (HOSPITAL_BASED_OUTPATIENT_CLINIC_OR_DEPARTMENT_OTHER): Payer: Self-pay | Admitting: Emergency Medicine

## 2014-12-19 ENCOUNTER — Emergency Department (HOSPITAL_BASED_OUTPATIENT_CLINIC_OR_DEPARTMENT_OTHER): Payer: Medicare Other

## 2014-12-19 DIAGNOSIS — K5792 Diverticulitis of intestine, part unspecified, without perforation or abscess without bleeding: Secondary | ICD-10-CM

## 2014-12-19 DIAGNOSIS — E785 Hyperlipidemia, unspecified: Secondary | ICD-10-CM | POA: Insufficient documentation

## 2014-12-19 DIAGNOSIS — K5790 Diverticulosis of intestine, part unspecified, without perforation or abscess without bleeding: Secondary | ICD-10-CM | POA: Diagnosis not present

## 2014-12-19 DIAGNOSIS — Z8742 Personal history of other diseases of the female genital tract: Secondary | ICD-10-CM | POA: Diagnosis not present

## 2014-12-19 DIAGNOSIS — E559 Vitamin D deficiency, unspecified: Secondary | ICD-10-CM | POA: Insufficient documentation

## 2014-12-19 DIAGNOSIS — K219 Gastro-esophageal reflux disease without esophagitis: Secondary | ICD-10-CM | POA: Diagnosis not present

## 2014-12-19 DIAGNOSIS — M1990 Unspecified osteoarthritis, unspecified site: Secondary | ICD-10-CM | POA: Diagnosis not present

## 2014-12-19 DIAGNOSIS — Z79899 Other long term (current) drug therapy: Secondary | ICD-10-CM | POA: Diagnosis not present

## 2014-12-19 DIAGNOSIS — R000 Tachycardia, unspecified: Secondary | ICD-10-CM | POA: Insufficient documentation

## 2014-12-19 DIAGNOSIS — Z872 Personal history of diseases of the skin and subcutaneous tissue: Secondary | ICD-10-CM | POA: Diagnosis not present

## 2014-12-19 DIAGNOSIS — Z7982 Long term (current) use of aspirin: Secondary | ICD-10-CM | POA: Insufficient documentation

## 2014-12-19 DIAGNOSIS — J45909 Unspecified asthma, uncomplicated: Secondary | ICD-10-CM | POA: Insufficient documentation

## 2014-12-19 DIAGNOSIS — M810 Age-related osteoporosis without current pathological fracture: Secondary | ICD-10-CM | POA: Diagnosis not present

## 2014-12-19 DIAGNOSIS — R197 Diarrhea, unspecified: Secondary | ICD-10-CM

## 2014-12-19 DIAGNOSIS — E042 Nontoxic multinodular goiter: Secondary | ICD-10-CM | POA: Diagnosis not present

## 2014-12-19 DIAGNOSIS — H9191 Unspecified hearing loss, right ear: Secondary | ICD-10-CM | POA: Insufficient documentation

## 2014-12-19 DIAGNOSIS — Z86718 Personal history of other venous thrombosis and embolism: Secondary | ICD-10-CM | POA: Diagnosis not present

## 2014-12-19 DIAGNOSIS — Z87442 Personal history of urinary calculi: Secondary | ICD-10-CM | POA: Diagnosis not present

## 2014-12-19 DIAGNOSIS — E860 Dehydration: Secondary | ICD-10-CM | POA: Diagnosis not present

## 2014-12-19 HISTORY — DX: Disorder of kidney and ureter, unspecified: N28.9

## 2014-12-19 LAB — URINALYSIS, ROUTINE W REFLEX MICROSCOPIC
Bilirubin Urine: NEGATIVE
Glucose, UA: NEGATIVE mg/dL
Hgb urine dipstick: NEGATIVE
KETONES UR: 40 mg/dL — AB
LEUKOCYTES UA: NEGATIVE
NITRITE: NEGATIVE
PH: 5.5 (ref 5.0–8.0)
Protein, ur: NEGATIVE mg/dL
Specific Gravity, Urine: 1.03 (ref 1.005–1.030)
Urobilinogen, UA: 0.2 mg/dL (ref 0.0–1.0)

## 2014-12-19 LAB — CBC WITH DIFFERENTIAL/PLATELET
BASOS ABS: 0 10*3/uL (ref 0.0–0.1)
Basophils Relative: 0 % (ref 0–1)
EOS PCT: 0 % (ref 0–5)
Eosinophils Absolute: 0 10*3/uL (ref 0.0–0.7)
HCT: 36.6 % (ref 36.0–46.0)
HEMOGLOBIN: 12.2 g/dL (ref 12.0–15.0)
Lymphocytes Relative: 35 % (ref 12–46)
Lymphs Abs: 2.5 10*3/uL (ref 0.7–4.0)
MCH: 31 pg (ref 26.0–34.0)
MCHC: 33.3 g/dL (ref 30.0–36.0)
MCV: 93.1 fL (ref 78.0–100.0)
MONO ABS: 0.9 10*3/uL (ref 0.1–1.0)
Monocytes Relative: 12 % (ref 3–12)
NEUTROS PCT: 53 % (ref 43–77)
Neutro Abs: 3.7 10*3/uL (ref 1.7–7.7)
PLATELETS: 276 10*3/uL (ref 150–400)
RBC: 3.93 MIL/uL (ref 3.87–5.11)
RDW: 11.9 % (ref 11.5–15.5)
WBC: 7 10*3/uL (ref 4.0–10.5)

## 2014-12-19 LAB — COMPREHENSIVE METABOLIC PANEL
ALK PHOS: 70 U/L (ref 38–126)
ALT: 24 U/L (ref 14–54)
AST: 26 U/L (ref 15–41)
Albumin: 3.4 g/dL — ABNORMAL LOW (ref 3.5–5.0)
Anion gap: 11 (ref 5–15)
BUN: 30 mg/dL — AB (ref 6–20)
CALCIUM: 9.2 mg/dL (ref 8.9–10.3)
CO2: 19 mmol/L — AB (ref 22–32)
CREATININE: 1.23 mg/dL — AB (ref 0.44–1.00)
Chloride: 108 mmol/L (ref 101–111)
GFR calc Af Amer: 51 mL/min — ABNORMAL LOW (ref >60)
GFR calc non Af Amer: 44 mL/min — ABNORMAL LOW (ref >60)
Glucose, Bld: 103 mg/dL — ABNORMAL HIGH (ref 65–99)
POTASSIUM: 3.5 mmol/L (ref 3.5–5.1)
SODIUM: 138 mmol/L (ref 135–145)
TOTAL PROTEIN: 6.5 g/dL (ref 6.5–8.1)
Total Bilirubin: 1.2 mg/dL (ref 0.3–1.2)

## 2014-12-19 LAB — I-STAT CG4 LACTIC ACID, ED: Lactic Acid, Venous: 2.13 mmol/L (ref 0.5–2.0)

## 2014-12-19 LAB — LIPASE, BLOOD: LIPASE: 13 U/L — AB (ref 22–51)

## 2014-12-19 MED ORDER — CIPROFLOXACIN IN D5W 400 MG/200ML IV SOLN: 400.0000 mg | Freq: Once | INTRAVENOUS | Status: DC

## 2014-12-19 MED ORDER — IOHEXOL 300 MG/ML  SOLN
50.0000 mL | Freq: Once | INTRAMUSCULAR | Status: AC | PRN
  Administered 2014-12-19: 50 mL via ORAL

## 2014-12-19 MED ORDER — METRONIDAZOLE IN NACL 5-0.79 MG/ML-% IV SOLN
500.0000 mg | Freq: Once | INTRAVENOUS | Status: AC
  Administered 2014-12-19: 500 mg via INTRAVENOUS
  Filled 2014-12-19: qty 100

## 2014-12-19 MED ORDER — SODIUM CHLORIDE 0.9 % IV BOLUS (SEPSIS)
1000.0000 mL | Freq: Once | INTRAVENOUS | Status: AC
  Administered 2014-12-19: 1000 mL via INTRAVENOUS

## 2014-12-19 MED ORDER — IOHEXOL 300 MG/ML  SOLN
75.0000 mL | Freq: Once | INTRAMUSCULAR | Status: AC | PRN
  Administered 2014-12-19: 75 mL via INTRAVENOUS

## 2014-12-19 MED ORDER — SODIUM CHLORIDE 0.9 % IV SOLN
INTRAVENOUS | Status: DC
  Administered 2014-12-19: 15:00:00 via INTRAVENOUS

## 2014-12-19 MED ORDER — ONDANSETRON HCL 4 MG/2ML IJ SOLN
4.0000 mg | Freq: Once | INTRAMUSCULAR | Status: AC
  Administered 2014-12-19: 4 mg via INTRAVENOUS
  Filled 2014-12-19: qty 2

## 2014-12-19 NOTE — ED Provider Notes (Signed)
TIME SEEN: 2:35 PM  CHIEF COMPLAINT: Diarrhea  HPI: Pt is a 69 y.o. female with history of hypothyroidism, previous DVT no longer on anticoagulation, hyperlipidemia who presents to the emergency department with complaints of 2 weeks of diarrhea. States she was seen by her PCP last week and was told that this was a virus. States symptoms are not better. She states she has had 20 nonbloody bowel movements in the past 24 hours. She has had some nausea but no vomiting. Denies abdominal pain. No fever. No recent sick contacts, hospitalization, travel or antibiotics use. She is status post hysterectomy and cholecystectomy.  ROS: See HPI Constitutional: no fever  Eyes: no drainage  ENT: no runny nose   Cardiovascular:  no chest pain  Resp: no SOB  GI: no vomiting GU: no dysuria Integumentary: no rash  Allergy: no hives  Musculoskeletal: no leg swelling  Neurological: no slurred speech ROS otherwise negative  PAST MEDICAL HISTORY/PAST SURGICAL HISTORY:  Past Medical History  Diagnosis Date  . Hyperlipemia   . Asthma   . Kidney calculi     recurrent, lithotripsy  . DVT (deep venous thrombosis) 2010    R leg, treated with coumadin x 6 months  . Meniere's disease   . Postural dizziness     postural syncope x 2  . Multinodular goiter   . Rosacea   . Osteoarthritis   . GERD (gastroesophageal reflux disease)   . Post-menopausal   . Allergic rhinitis   . Osteoporosis   . Vitamin D deficiency   . Hearing disorder     R ear deafness  . Venous insufficiency of leg     post thrombotic syndrome    MEDICATIONS:  Prior to Admission medications   Medication Sig Start Date End Date Taking? Authorizing Provider  albuterol (PROVENTIL HFA;VENTOLIN HFA) 108 (90 BASE) MCG/ACT inhaler USE AS NEEDED    Historical Provider, MD  aspirin 81 MG chewable tablet Chew 81 mg by mouth daily.      Historical Provider, MD  Azelaic Acid (FINACEA) 15 % cream Apply topically daily. After skin is thoroughly  washed and patted dry, gently but thoroughly massage a thin film of azelaic acid cream into the affected area twice daily, in the morning and evening.    Historical Provider, MD  cetirizine (ZYRTEC) 10 MG tablet Take 10 mg by mouth daily.    Historical Provider, MD  diazepam (VALIUM) 5 MG tablet Take 5 mg by mouth every 6 (six) hours as needed for anxiety.    Historical Provider, MD  levothyroxine (SYNTHROID, LEVOTHROID) 50 MCG tablet TAKE 1 TABLET(S) BY MOUTH DAILY 04/05/13   Gillian Scarce, MD  Magnesium 500 MG CAPS Take 1 capsule by mouth daily.      Historical Provider, MD  mometasone (ASMANEX 14 METERED DOSES) 220 MCG/INH inhaler USE I TO 2 PUFF DAILY AT BED TIME    Historical Provider, MD  mometasone (NASONEX) 50 MCG/ACT nasal spray Place 2 sprays into the nose at bedtime.      Historical Provider, MD  montelukast (SINGULAIR) 10 MG tablet Take 10 mg by mouth at bedtime.    Historical Provider, MD  Omeprazole Magnesium (PRILOSEC OTC PO) Take by mouth daily.    Historical Provider, MD  potassium chloride (K-DUR) 10 MEQ tablet TAKE 1 TABLET BY MOUTH TWICE DAILY 04/05/13   Gillian Scarce, MD  promethazine (PHENERGAN) 12.5 MG suppository Place 12.5 mg rectally every 6 (six) hours as needed for nausea.  Historical Provider, MD  Riboflavin (VITAMIN B-2 PO) TAKE  200MG  4 TIMES DAILY    Historical Provider, MD  rosuvastatin (CRESTOR) 40 MG tablet Take 1/2 - 1 tab daily 03/12/13 03/12/14  Gillian Scarce, MD  Vitamin D, Ergocalciferol, (DRISDOL) 50000 UNITS CAPS Take 50,000 Units by mouth every 7 (seven) days. Take on Sunday     Historical Provider, MD  vitamin E 400 UNIT capsule Take 400 Units by mouth daily.      Historical Provider, MD    ALLERGIES:  Allergies  Allergen Reactions  . Methotrexate Derivatives Anaphylaxis    Cough, liver failure  . Lyrica [Pregabalin]     Slurred speech, blurred vision, confusion, incohert  . Other     PHENEGAN TABLET : MAKE PATIENT LUPPY PER PT., THUS CONFUSE  .  Prednisone Swelling    Hives on face  . Sulfa Antibiotics Diarrhea    indigestion    SOCIAL HISTORY:  History  Substance Use Topics  . Smoking status: Never Smoker   . Smokeless tobacco: Never Used  . Alcohol Use: No    FAMILY HISTORY: Family History  Problem Relation Age of Onset  . Thyroid disease Father   . Heart disease Father   . Heart disease Mother   . Diabetes Mother   . Cancer Brother     fibrohisteofibroma  . Heart disease Brother   . Thyroid disease Maternal Grandmother     EXAM: BP 125/59 mmHg  Pulse 123  Temp(Src) 97.7 F (36.5 C) (Oral)  Resp 20  Ht 5\' 1"  (1.549 m)  Wt 148 lb (67.132 kg)  BMI 27.98 kg/m2  SpO2 99% CONSTITUTIONAL: Alert and oriented and responds appropriately to questions. Appears uncomfortable but nontoxic, appears pale HEAD: Normocephalic EYES: Conjunctivae clear, PERRL ENT: normal nose; no rhinorrhea; dry mucous membranes; pharynx without lesions noted NECK: Supple, no meningismus, no LAD  CARD: Regular and tachycardic; S1 and S2 appreciated; no murmurs, no clicks, no rubs, no gallops RESP: Normal chest excursion without splinting or tachypnea; breath sounds clear and equal bilaterally; no wheezes, no rhonchi, no rales, no hypoxia or respiratory distress, speaking full sentences ABD/GI: Normal bowel sounds; non-distended; soft, tender to palpation the left lower quadrant with voluntary guarding, no rebound, no peritoneal signs BACK:  The back appears normal and is non-tender to palpation, there is no CVA tenderness EXT: Normal ROM in all joints; non-tender to palpation; no edema; normal capillary refill; no cyanosis, no calf tenderness or swelling    SKIN: Normal color for age and race; warm NEURO: Moves all extremities equally, sensation to light touch intact diffusely, cranial nerves II through XII intact PSYCH: The patient's mood and manner are appropriate. Grooming and personal hygiene are appropriate.  MEDICAL DECISION MAKING:  Patient here with 2 weeks of diarrhea. She does appear dry on exam and is tachycardic but otherwise hemodynamically stable and afebrile. She denies abdominal pain but does have left lower quadrant tenderness on exam with guarding. Will obtain abdominal labs, CT of her abdomen and pelvis, urine, stool cultures. We'll give IV fluids, Zofran.  ED PROGRESS: Patient's labs show mildly elevated lactate of 2.13, bicarbonate of 19. Creatinine is also mildly elevated at 1.23. Suspect this is all secondary to dehydration. She is receiving IV fluids.   Patient still appears very pale, uncomfortable. I feel she needs admission for IV hydration. Her PCP is Dr. Julius Bowels with [facility].  They would prefer admission at [facility].   Pt has diverticulitis without abscess  or perforation. We'll give Cipro and Flagyl. Discussed with Dr. Georgetta Haber with hospitalist service at [facility] who agrees 6 at the patient in transfer if beds are available.  Layla Maw Jaishawn Witzke, DO 12/19/14 1635

## 2014-12-19 NOTE — ED Notes (Addendum)
Pt in c/o diarrhea x 2 weeks. Saw primary MD and was dx with virus, but sx have not gotten better. States 20 episodes of stool in last 24 hours, some times just mucous. States she has kidney failure and is concerned about being dehydrated.

## 2019-07-02 ENCOUNTER — Emergency Department (HOSPITAL_BASED_OUTPATIENT_CLINIC_OR_DEPARTMENT_OTHER): Payer: Medicare Other

## 2019-07-02 ENCOUNTER — Emergency Department (HOSPITAL_BASED_OUTPATIENT_CLINIC_OR_DEPARTMENT_OTHER)
Admission: EM | Admit: 2019-07-02 | Discharge: 2019-07-02 | Disposition: A | Discharge status: Home / Self Care | Payer: Medicare Other | Attending: Emergency Medicine | Admitting: Emergency Medicine

## 2019-07-02 ENCOUNTER — Other Ambulatory Visit: Payer: Self-pay

## 2019-07-02 DIAGNOSIS — N183 Chronic kidney disease, stage 3 unspecified: Secondary | ICD-10-CM | POA: Diagnosis not present

## 2019-07-02 DIAGNOSIS — J45909 Unspecified asthma, uncomplicated: Secondary | ICD-10-CM | POA: Insufficient documentation

## 2019-07-02 DIAGNOSIS — Z7982 Long term (current) use of aspirin: Secondary | ICD-10-CM | POA: Diagnosis not present

## 2019-07-02 DIAGNOSIS — R112 Nausea with vomiting, unspecified: Secondary | ICD-10-CM | POA: Diagnosis not present

## 2019-07-02 DIAGNOSIS — R000 Tachycardia, unspecified: Secondary | ICD-10-CM | POA: Diagnosis not present

## 2019-07-02 DIAGNOSIS — R197 Diarrhea, unspecified: Secondary | ICD-10-CM | POA: Diagnosis not present

## 2019-07-02 DIAGNOSIS — Z20828 Contact with and (suspected) exposure to other viral communicable diseases: Secondary | ICD-10-CM | POA: Diagnosis not present

## 2019-07-02 DIAGNOSIS — Z79899 Other long term (current) drug therapy: Secondary | ICD-10-CM | POA: Diagnosis not present

## 2019-07-02 LAB — CBC WITH DIFFERENTIAL/PLATELET
Abs Immature Granulocytes: 0 10*3/uL (ref 0.00–0.07)
Basophils Absolute: 0 10*3/uL (ref 0.0–0.1)
Basophils Relative: 0 %
Eosinophils Absolute: 0 10*3/uL (ref 0.0–0.5)
Eosinophils Relative: 0 %
HCT: 37.2 % (ref 36.0–46.0)
Hemoglobin: 12 g/dL (ref 12.0–15.0)
Immature Granulocytes: 0 %
Lymphocytes Relative: 15 %
Lymphs Abs: 0.9 10*3/uL (ref 0.7–4.0)
MCH: 32.2 pg (ref 26.0–34.0)
MCHC: 32.3 g/dL (ref 30.0–36.0)
MCV: 99.7 fL (ref 80.0–100.0)
Monocytes Absolute: 0.7 10*3/uL (ref 0.1–1.0)
Monocytes Relative: 12 %
Neutro Abs: 4.1 10*3/uL (ref 1.7–7.7)
Neutrophils Relative %: 73 %
Platelets: 120 10*3/uL — ABNORMAL LOW (ref 150–400)
RBC: 3.73 MIL/uL — ABNORMAL LOW (ref 3.87–5.11)
RDW: 11.9 % (ref 11.5–15.5)
WBC: 5.6 10*3/uL (ref 4.0–10.5)
nRBC: 0 % (ref 0.0–0.2)

## 2019-07-02 LAB — URINALYSIS, ROUTINE W REFLEX MICROSCOPIC
Glucose, UA: NEGATIVE mg/dL
Ketones, ur: 15 mg/dL — AB
Leukocytes,Ua: NEGATIVE
Nitrite: NEGATIVE
Protein, ur: NEGATIVE mg/dL
Specific Gravity, Urine: >1.03 — ABNORMAL HIGH (ref 1.005–1.030)
pH: 5.5 (ref 5.0–8.0)

## 2019-07-02 LAB — COMPREHENSIVE METABOLIC PANEL
ALT: 36 U/L (ref 0–44)
AST: 56 U/L — ABNORMAL HIGH (ref 15–41)
Albumin: 3.7 g/dL (ref 3.5–5.0)
Alkaline Phosphatase: 60 U/L (ref 38–126)
Anion gap: 11 (ref 5–15)
BUN: 45 mg/dL — ABNORMAL HIGH (ref 8–23)
CO2: 20 mmol/L — ABNORMAL LOW (ref 22–32)
Calcium: 9.6 mg/dL (ref 8.9–10.3)
Chloride: 110 mmol/L (ref 98–111)
Creatinine, Ser: 1.16 mg/dL — ABNORMAL HIGH (ref 0.44–1.00)
GFR calc Af Amer: 54 mL/min — ABNORMAL LOW (ref >60)
GFR calc non Af Amer: 46 mL/min — ABNORMAL LOW (ref >60)
Glucose, Bld: 137 mg/dL — ABNORMAL HIGH (ref 70–99)
Potassium: 4.2 mmol/L (ref 3.5–5.1)
Sodium: 141 mmol/L (ref 135–145)
Total Bilirubin: 1.2 mg/dL (ref 0.3–1.2)
Total Protein: 6.4 g/dL — ABNORMAL LOW (ref 6.5–8.1)

## 2019-07-02 LAB — URINALYSIS, MICROSCOPIC (REFLEX)

## 2019-07-02 LAB — SARS CORONAVIRUS 2 AG (30 MIN TAT): SARS Coronavirus 2 Ag: NEGATIVE

## 2019-07-02 LAB — LACTIC ACID, PLASMA: Lactic Acid, Venous: 1.7 mmol/L (ref 0.5–1.9)

## 2019-07-02 MED ORDER — ONDANSETRON 4 MG PO TBDP: 4.0000 mg | ORAL_TABLET | Freq: Three times a day (TID) | ORAL | 0 refills | Status: DC | PRN

## 2019-07-02 MED ORDER — SODIUM CHLORIDE 0.9 % IV BOLUS
500.0000 mL | Freq: Once | INTRAVENOUS | Status: AC
  Administered 2019-07-02: 500 mL via INTRAVENOUS

## 2019-07-02 MED ORDER — METOPROLOL TARTRATE 5 MG/5ML IV SOLN
5.0000 mg | Freq: Once | INTRAVENOUS | Status: AC
  Administered 2019-07-02: 5 mg via INTRAVENOUS
  Filled 2019-07-02: qty 5

## 2019-07-02 MED ORDER — PROBIOTIC 1-250 BILLION-MG PO CAPS: 1.0000 | ORAL_CAPSULE | Freq: Every day | ORAL | 0 refills | Status: DC

## 2019-07-02 MED ORDER — ONDANSETRON HCL 4 MG/2ML IJ SOLN
4.0000 mg | Freq: Once | INTRAMUSCULAR | Status: AC
  Administered 2019-07-02: 18:00:00 4 mg via INTRAVENOUS
  Filled 2019-07-02: qty 2

## 2019-07-02 MED ORDER — SODIUM CHLORIDE 0.9 % IV BOLUS
500.0000 mL | Freq: Once | INTRAVENOUS | Status: AC
  Administered 2019-07-02: 15:00:00 500 mL via INTRAVENOUS

## 2019-07-02 NOTE — ED Notes (Signed)
Pt resting comfortably at this time. Denies pain, nausea and vomiting improved following IV fluids (451ml given by ems en route) and zofran given PTA.

## 2019-07-02 NOTE — ED Notes (Signed)
Pt's husband updated, very appreciative.

## 2019-07-02 NOTE — ED Triage Notes (Signed)
Sinus congestion, headache earlier in week, abt initiated Tuesday, has had nausea, vomiting since starting medication.  Generalized weakness.  Denies fever.  Unable to take medications normally due to nausea.  Was going to have covid test today, but felt too ill.  IV fluids given en route, and zofran with some improvement

## 2019-07-02 NOTE — Discharge Instructions (Addendum)
1. Medications: I would recommend that you discontinue your antibiotic as it could potentially be the source of your symptoms.  Take Zofran as needed for nausea.  Let this medication dissolve under your tongue and wait around 10-20 minutes before eating or drinking after taking this medication.  Start taking a probiotic or eating yogurt daily to help get good bacteria back in your gut. 2. Treatment: rest, drink plenty of fluids, advance diet slowly.  Eat small meals frequently throughout the day.  Start with mostly clear liquids, broth, etc. then move onto bland foods such as plain chicken, saltine crackers, peanut butter. 3. Follow Up: Please followup with your primary doctor in 3-7 days for discussion of your diagnoses and further evaluation after today's visit; if you do not have a primary care doctor use the resource guide provided to find one; Please return to the ER for persistent vomiting, high fevers or worsening symptoms.   Your Covid test will result within 24 hours.  You will receive a phone call if your test is positive, no phone call if your test is negative.  You can also view your results online on MyChart.

## 2019-07-02 NOTE — ED Notes (Signed)
Offered po fluids

## 2019-07-02 NOTE — ED Notes (Signed)
Pt still reports mild nausea.  Taking only sips of water.

## 2019-07-02 NOTE — ED Provider Notes (Signed)
[facility] EMERGENCY DEPARTMENT Provider Note   CSN: [id number] Arrival date & time: 07/02/19  1318     History Chief Complaint  Patient presents with   Nausea   Emesis    Betty Atkinson is a 73 y.o. female with history of GERD, hyperlipidemia, nephrolithiasis, stage III CKD, presents for evaluation of acute onset, progressively worsening nausea, vomiting, diarrhea for 3 days.  Reports that she was started on an antibiotic the day before for what she thought was a sinus infection (symptoms of sinus headache and nasal congestion, mild cough) and was started on cefdinir which she has taken multiple times previously.  On Tuesday she developed nausea vomiting and diarrhea and has not been able to tolerate any p.o. food fluids or medications since then.  Reports emesis is nonbloody nonbilious and stools are watery and nonbloody.  Denies abdominal pain, chest pain or shortness of breath.  She notes palpitations and states she has a history of tachycardia for which she takes metoprolol but again has not had any of her medications.  No known sick contacts, no known Covid exposures.  The history is provided by the patient.       Past Medical History:  Diagnosis Date   Allergic rhinitis    Asthma    DVT (deep venous thrombosis) 2010   R leg, treated with coumadin x 6 months   GERD (gastroesophageal reflux disease)    Hearing disorder    R ear deafness   Hyperlipemia    Kidney calculi    recurrent, lithotripsy   Meniere's disease    Multinodular goiter    Osteoarthritis    Osteoporosis    Post-menopausal    Postural dizziness    postural syncope x 2   Renal insufficiency    Rosacea    Venous insufficiency of leg    post thrombotic syndrome   Vitamin D deficiency     Patient Active Problem List   Diagnosis Date Noted   Need for prophylactic vaccination and inoculation against influenza 05/16/2013   Osteoarthritis 05/16/2013   Cellulitis of right  arm 03/06/2013   Syncope 02/16/2013   Preoperative cardiovascular examination 02/16/2013   Shortness of breath 02/16/2013   Sinus tachycardia 02/16/2013   Unspecified asthma(493.90) 02/08/2013   Potassium (K) deficiency 02/08/2013   Meniere's disease 02/08/2013   Weight gain 02/08/2013   Unspecified hypothyroidism 02/08/2013   History of DVT (deep vein thrombosis) 12/20/2011   H/O renal calculi 12/20/2011   Personal history of goiter 12/20/2011   DJD (degenerative joint disease) 12/20/2011   Other and unspecified hyperlipidemia 12/20/2011   GERD (gastroesophageal reflux disease) 12/20/2011   Menopause 12/20/2011   Allergic rhinitis 12/20/2011    Past Surgical History:  Procedure Laterality Date   CARDIAC CATHETERIZATION  2005   no CAD by cath at [facility]   LITHOTRIPSY     TONSILLECTOMY     TOTAL ABDOMINAL HYSTERECTOMY W/ BILATERAL SALPINGOOPHORECTOMY  1990     OB History    Gravida  2   Para  2   Term      Preterm      AB      Living  2     SAB      TAB      Ectopic      Multiple      Live Births              Family History  Problem Relation Age of Onset   Thyroid  disease Father    Heart disease Father    Heart disease Mother    Diabetes Mother    Cancer Brother        fibrohisteofibroma   Heart disease Brother    Thyroid disease Maternal Grandmother     Social History   Tobacco Use   Smoking status: Never Smoker   Smokeless tobacco: Never Used  Substance Use Topics   Alcohol use: No   Drug use: No    Home Medications Prior to Admission medications   Medication Sig Start Date End Date Taking? Authorizing Provider  famotidine (PEPCID) 10 MG tablet Take 10 mg by mouth 2 (two) times daily.   Yes [provider]  albuterol (PROVENTIL HFA;VENTOLIN HFA) 108 (90 BASE) MCG/ACT inhaler USE AS NEEDED    [provider]  aspirin 81 MG chewable tablet Chew 81 mg by mouth daily.       [provider]  Azelaic Acid (FINACEA) 15 % cream Apply topically daily. After skin is thoroughly washed and patted dry, gently but thoroughly massage a thin film of azelaic acid cream into the affected area twice daily, in the morning and evening.    [provider]  Bacillus Coagulans-Inulin (PROBIOTIC) 1-250 BILLION-MG CAPS Take 1 capsule by mouth daily. 07/02/19   Jaeleen Inzunza A, PA-C  cetirizine (ZYRTEC) 10 MG tablet Take 10 mg by mouth daily.    [provider]  diazepam (VALIUM) 5 MG tablet Take 5 mg by mouth every 6 (six) hours as needed for anxiety.    [provider]  levothyroxine (SYNTHROID, LEVOTHROID) 50 MCG tablet TAKE 1 TABLET(S) BY MOUTH DAILY 04/05/13   Zanard, Hinton Dyer, MD  Magnesium 500 MG CAPS Take 1 capsule by mouth daily.      [provider]  mometasone (ASMANEX 14 METERED DOSES) 220 MCG/INH inhaler USE I TO 2 PUFF DAILY AT BED TIME    [provider]  mometasone (NASONEX) 50 MCG/ACT nasal spray Place 2 sprays into the nose at bedtime.      [provider]  montelukast (SINGULAIR) 10 MG tablet Take 10 mg by mouth at bedtime.    [provider]  Omeprazole Magnesium (PRILOSEC OTC PO) Take by mouth daily.    [provider]  ondansetron (ZOFRAN ODT) 4 MG disintegrating tablet Take 1 tablet (4 mg total) by mouth every 8 (eight) hours as needed for nausea or vomiting. 07/02/19   Jessey Stehlin A, PA-C  potassium chloride (K-DUR) 10 MEQ tablet TAKE 1 TABLET BY MOUTH TWICE DAILY 04/05/13   Zanard, Hinton Dyer, MD  promethazine (PHENERGAN) 12.5 MG suppository Place 12.5 mg rectally every 6 (six) hours as needed for nausea.    [provider]  Riboflavin (VITAMIN B-2 PO) TAKE  200MG  4 TIMES DAILY    [provider]  rosuvastatin (CRESTOR) 40 MG tablet Take 1/2 - 1 tab daily 03/12/13 03/12/14  Zanard, 05/12/14, MD  Vitamin D, Ergocalciferol, (DRISDOL) 50000 UNITS CAPS Take 50,000 Units by mouth every 7  (seven) days. Take on Sunday     [provider]  vitamin E 400 UNIT capsule Take 400 Units by mouth daily.      [provider]    Allergies    Methotrexate derivatives, Lyrica [pregabalin], Other, Prednisone, and Sulfa antibiotics  Review of Systems   Review of Systems  Constitutional: Positive for fatigue. Negative for chills and fever.  HENT: Positive for congestion and sinus pressure.   Respiratory: Positive  for cough. Negative for shortness of breath.   Cardiovascular: Positive for palpitations. Negative for chest pain.  Gastrointestinal: Positive for diarrhea, nausea and vomiting. Negative for abdominal pain.  Genitourinary: Negative for dysuria, frequency, hematuria and urgency.  Neurological: Positive for light-headedness. Negative for syncope.  All other systems reviewed and are negative.   Physical Exam Updated Vital Signs BP (!) 122/55    Pulse (!) 108    Temp 98.4 F (36.9 C) (Oral)    Resp (!) 23    SpO2 99%   Physical Exam Vitals and nursing note reviewed.  Constitutional:      General: She is not in acute distress.    Appearance: She is well-developed.  HENT:     Head: Normocephalic and atraumatic.     Mouth/Throat:     Mouth: Mucous membranes are dry.  Eyes:     General:        Right eye: No discharge.        Left eye: No discharge.     Conjunctiva/sclera: Conjunctivae normal.  Neck:     Vascular: No JVD.     Trachea: No tracheal deviation.  Cardiovascular:     Rate and Rhythm: Regular rhythm. Tachycardia present.  Pulmonary:     Effort: Pulmonary effort is normal.     Breath sounds: Normal breath sounds.  Abdominal:     General: Abdomen is flat. Bowel sounds are normal. There is no distension.     Palpations: Abdomen is soft.     Tenderness: There is no abdominal tenderness. There is no right CVA tenderness, left CVA tenderness, guarding or rebound.  Skin:    General: Skin is dry.     Findings: No erythema.  Neurological:      Mental Status: She is alert.  Psychiatric:        Behavior: Behavior normal.     ED Results / Procedures / Treatments   Labs (all labs ordered are listed, but only abnormal results are displayed) Labs Reviewed  COMPREHENSIVE METABOLIC PANEL - Abnormal; Notable for the following components:      Result Value   CO2 20 (*)    Glucose, Bld 137 (*)    BUN 45 (*)    Creatinine, Ser 1.16 (*)    Total Protein 6.4 (*)    AST 56 (*)    GFR calc non Af Amer 46 (*)    GFR calc Af Amer 54 (*)    All other components within normal limits  CBC WITH DIFFERENTIAL/PLATELET - Abnormal; Notable for the following components:   RBC 3.73 (*)    Platelets 120 (*)    All other components within normal limits  URINALYSIS, ROUTINE W REFLEX MICROSCOPIC - Abnormal; Notable for the following components:   Specific Gravity, Urine >1.030 (*)    Hgb urine dipstick TRACE (*)    Bilirubin Urine SMALL (*)    Ketones, ur 15 (*)    All other components within normal limits  URINALYSIS, MICROSCOPIC (REFLEX) - Abnormal; Notable for the following components:   Bacteria, UA MANY (*)    All other components within normal limits  SARS CORONAVIRUS 2 AG (30 MIN TAT)  CULTURE, BLOOD (ROUTINE X 2)  CULTURE, BLOOD (ROUTINE X 2)  URINE CULTURE  NOVEL CORONAVIRUS, NAA (HOSP ORDER, SEND-OUT TO REF LAB; TAT 18-24 HRS)  LACTIC ACID, PLASMA    EKG None  Radiology No results found.  Procedures Procedures (including critical care time)  Medications Ordered in ED Medications  sodium chloride 0.9 % bolus 500 mL (0 mLs Intravenous Stopped 07/02/19 1629)  sodium chloride 0.9 % bolus 500 mL (0 mLs Intravenous Stopped 07/02/19 1745)  metoprolol tartrate (LOPRESSOR) injection 5 mg (5 mg Intravenous Given 07/02/19 1807)  ondansetron (ZOFRAN) injection 4 mg (4 mg Intravenous Given 07/02/19 1805)    ED Course  I have reviewed the triage vital signs and the nursing notes.  Pertinent labs & imaging results that were  available during my care of the patient were reviewed by me and considered in my medical decision making (see chart for details).    MDM Rules/Calculators/A&P                      Betty Atkinson was evaluated in Emergency Department on 07/02/2019 for the symptoms described in the history of present illness. She was evaluated in the context of the global COVID-19 pandemic, which necessitated consideration that the patient might be at risk for infection with the SARS-CoV-2 virus that causes COVID-19. Institutional protocols and algorithms that pertain to the evaluation of patients at risk for COVID-19 are in a state of rapid change based on information released by regulatory bodies including the CDC and federal and state organizations. These policies and algorithms were followed during the patient's care in the ED.  Patient presenting for evaluation of nausea vomiting and diarrhea for 3 days.  Symptoms began after starting a course of antibiotics for management of a sinus infection.  Sinus infection symptoms are improving.  She is afebrile, persistently tachycardic in the ED however has not been able to tolerate any of her home medications including her metoprolol which she takes for tachycardia.  She denies any chest pain or shortness of breath and I doubt ACS/MI.  Considered PE however she has no complaint of shortness of breath, no hypoxia and her symptoms seem more infectious in etiology.  Her chest x-ray shows no pleural effusion or pneumothorax.  She has some minimal bronchitic changes with subsegmental atelectasis at the right lung base.  Abdomen is soft and nontender.  No peritoneal signs.  Lab work reviewed by me shows elevated BUN and creatinine around patient's baseline.  No metabolic derangements or electrolyte abnormalities.  No leukocytosis or anemia.  She was given IV fluids with some improvement in her heart rate.  Was also given a dose of IV metoprolol with improvement.  UA concerning for  dehydration with elevated specific gravity and ketonuria noted.  She is many bacteria but no pyuria, WBCs, RBCs, or nitrites and she has no urinary symptoms so I have a low suspicion of UTI but we will culture this.  She was given IV fluids and Zofran and on reevaluation reports that she is feeling much better.  She is tolerating p.o. fluids without difficulty.  She would really like to go home.  Considered C. difficile infection but she has not had a bowel movement while in the ED over the course of several hours.  She likely has a viral gastroenteritis.  Doubt acute surgical abdominal pathology given reassuring serial abdominal examinations no tenderness or peritoneal signs.  Rapid Covid test is negative, will obtain outpatient Covid test.  Will discharge with Zofran and probiotics, discussed advancing diet slowly.  Encouraged patient to discontinue her antibiotics that she likely did not have bacterial sinusitis.  Recommend close follow-up with PCP for reevaluation of symptoms.  Discussed strict ED return precautions. Patient verbalized understanding of and agreement with plan and is safe for discharge home  at this time. Discussed with Dr. Juleen China who agrees with assessment and plan at this time.  Final Clinical Impression(s) / ED Diagnoses Final diagnoses:  Nausea vomiting and diarrhea  Tachycardia    Rx / DC Orders ED Discharge Orders         Ordered    ondansetron (ZOFRAN ODT) 4 MG disintegrating tablet  Every 8 hours PRN     07/02/19 1914    Bacillus Coagulans-Inulin (PROBIOTIC) 1-250 BILLION-MG CAPS  Daily     07/02/19 1914           Bennye Alm 07/02/19 2048    Raeford Razor, MD 07/05/19 1047

## 2019-07-04 ENCOUNTER — Telehealth: Payer: Self-pay

## 2019-07-04 LAB — URINE CULTURE: Culture: 40000 — AB

## 2019-07-04 LAB — NOVEL CORONAVIRUS, NAA (HOSP ORDER, SEND-OUT TO REF LAB; TAT 18-24 HRS): SARS-CoV-2, NAA: NOT DETECTED

## 2019-07-04 NOTE — Telephone Encounter (Signed)

## 2019-07-07 LAB — CULTURE, BLOOD (ROUTINE X 2)
Culture: NO GROWTH
Culture: NO GROWTH

## 2019-07-10 ENCOUNTER — Emergency Department (HOSPITAL_COMMUNITY): Payer: Medicare Other

## 2019-07-10 ENCOUNTER — Other Ambulatory Visit: Payer: Self-pay

## 2019-07-10 ENCOUNTER — Encounter (HOSPITAL_COMMUNITY): Payer: Self-pay

## 2019-07-10 ENCOUNTER — Emergency Department (HOSPITAL_COMMUNITY)
Admission: EM | Admit: 2019-07-10 | Discharge: 2019-07-11 | Disposition: A | Discharge status: Home / Self Care | Payer: Medicare Other | Source: Home / Self Care | Attending: Emergency Medicine | Admitting: Emergency Medicine

## 2019-07-10 DIAGNOSIS — J45909 Unspecified asthma, uncomplicated: Secondary | ICD-10-CM | POA: Insufficient documentation

## 2019-07-10 DIAGNOSIS — Z20822 Contact with and (suspected) exposure to covid-19: Secondary | ICD-10-CM | POA: Insufficient documentation

## 2019-07-10 DIAGNOSIS — R109 Unspecified abdominal pain: Secondary | ICD-10-CM | POA: Insufficient documentation

## 2019-07-10 DIAGNOSIS — Z79899 Other long term (current) drug therapy: Secondary | ICD-10-CM | POA: Insufficient documentation

## 2019-07-10 DIAGNOSIS — E0520 Thyrotoxicosis with toxic multinodular goiter without thyrotoxic crisis or storm: Secondary | ICD-10-CM | POA: Diagnosis not present

## 2019-07-10 DIAGNOSIS — E0590 Thyrotoxicosis, unspecified without thyrotoxic crisis or storm: Secondary | ICD-10-CM | POA: Insufficient documentation

## 2019-07-10 DIAGNOSIS — Z7982 Long term (current) use of aspirin: Secondary | ICD-10-CM | POA: Insufficient documentation

## 2019-07-10 LAB — COMPREHENSIVE METABOLIC PANEL
ALT: 34 U/L (ref 0–44)
AST: 35 U/L (ref 15–41)
Albumin: 2.9 g/dL — ABNORMAL LOW (ref 3.5–5.0)
Alkaline Phosphatase: 63 U/L (ref 38–126)
Anion gap: 12 (ref 5–15)
BUN: 35 mg/dL — ABNORMAL HIGH (ref 8–23)
CO2: 21 mmol/L — ABNORMAL LOW (ref 22–32)
Calcium: 9.2 mg/dL (ref 8.9–10.3)
Chloride: 105 mmol/L (ref 98–111)
Creatinine, Ser: 1.08 mg/dL — ABNORMAL HIGH (ref 0.44–1.00)
GFR calc Af Amer: 59 mL/min — ABNORMAL LOW (ref >60)
GFR calc non Af Amer: 51 mL/min — ABNORMAL LOW (ref >60)
Glucose, Bld: 100 mg/dL — ABNORMAL HIGH (ref 70–99)
Potassium: 4.3 mmol/L (ref 3.5–5.1)
Sodium: 138 mmol/L (ref 135–145)
Total Bilirubin: 1 mg/dL (ref 0.3–1.2)
Total Protein: 5.6 g/dL — ABNORMAL LOW (ref 6.5–8.1)

## 2019-07-10 LAB — TSH: TSH: 0.011 u[IU]/mL — ABNORMAL LOW (ref 0.350–4.500)

## 2019-07-10 LAB — I-STAT CHEM 8, ED
BUN: 33 mg/dL — ABNORMAL HIGH (ref 8–23)
Calcium, Ion: 1.23 mmol/L (ref 1.15–1.40)
Chloride: 105 mmol/L (ref 98–111)
Creatinine, Ser: 1.1 mg/dL — ABNORMAL HIGH (ref 0.44–1.00)
Glucose, Bld: 96 mg/dL (ref 70–99)
HCT: 29 % — ABNORMAL LOW (ref 36.0–46.0)
Hemoglobin: 9.9 g/dL — ABNORMAL LOW (ref 12.0–15.0)
Potassium: 4.3 mmol/L (ref 3.5–5.1)
Sodium: 137 mmol/L (ref 135–145)
TCO2: 23 mmol/L (ref 22–32)

## 2019-07-10 LAB — CBC
HCT: 32.2 % — ABNORMAL LOW (ref 36.0–46.0)
Hemoglobin: 10.7 g/dL — ABNORMAL LOW (ref 12.0–15.0)
MCH: 31.8 pg (ref 26.0–34.0)
MCHC: 33.2 g/dL (ref 30.0–36.0)
MCV: 95.5 fL (ref 80.0–100.0)
Platelets: 136 10*3/uL — ABNORMAL LOW (ref 150–400)
RBC: 3.37 MIL/uL — ABNORMAL LOW (ref 3.87–5.11)
RDW: 11.5 % (ref 11.5–15.5)
WBC: 5 10*3/uL (ref 4.0–10.5)
nRBC: 0 % (ref 0.0–0.2)

## 2019-07-10 LAB — LIPASE, BLOOD: Lipase: 32 U/L (ref 11–51)

## 2019-07-10 LAB — TROPONIN I (HIGH SENSITIVITY)
Troponin I (High Sensitivity): 9 ng/L (ref <18)
Troponin I (High Sensitivity): 10 ng/L (ref <18)

## 2019-07-10 LAB — POC SARS CORONAVIRUS 2 AG -  ED: SARS Coronavirus 2 Ag: NEGATIVE

## 2019-07-10 MED ORDER — SODIUM CHLORIDE (PF) 0.9 % IJ SOLN
INTRAMUSCULAR | Status: AC
  Filled 2019-07-10: qty 50

## 2019-07-10 MED ORDER — SODIUM CHLORIDE 0.9 % IV BOLUS
1000.0000 mL | Freq: Once | INTRAVENOUS | Status: AC
  Administered 2019-07-10: 1000 mL via INTRAVENOUS

## 2019-07-10 MED ORDER — SODIUM CHLORIDE 0.9% FLUSH: 3.0000 mL | Freq: Once | INTRAVENOUS | Status: DC

## 2019-07-10 MED ORDER — ONDANSETRON HCL 4 MG/2ML IJ SOLN: 4.0000 mg | Freq: Once | INTRAMUSCULAR | Status: DC

## 2019-07-10 MED ORDER — PROPRANOLOL HCL 1 MG/ML IV SOLN
2.0000 mg | Freq: Once | INTRAVENOUS | Status: AC
  Administered 2019-07-10: 22:00:00 2 mg via INTRAVENOUS
  Filled 2019-07-10: qty 2

## 2019-07-10 MED ORDER — IOHEXOL 350 MG/ML SOLN
100.0000 mL | Freq: Once | INTRAVENOUS | Status: AC | PRN
  Administered 2019-07-10: 80 mL via INTRAVENOUS

## 2019-07-10 NOTE — ED Triage Notes (Signed)
Pt also reports swelling throughout, reddness, and tenderness throughout the right arm where IV was placed previously.

## 2019-07-10 NOTE — ED Provider Notes (Addendum)
[facility] DEPT Provider Note   CSN: [id number] Arrival date & time: 07/10/19  1653     History Chief Complaint  Patient presents with  . Fatigue  . Nausea    Betty Atkinson is a 74 y.o. female hx of DVT, GERD, here presenting with shortness of breath, abdominal pain, tachycardia, right arm pain.  Patient states that about 2 weeks ago, she was diagnosed with sinusitis and took several doses of ceftinir.  Patient states that shortly afterwards she had some diarrhea and some nausea and abdominal pain.  Patient was seen 6 days ago at [facility] .  She had slightly elevated BUN/creatinine and was thought to have some dehydration .  C. difficile was considered but patient had no bowel movements in the ER.  Patient was told to discontinue antibiotics.  Patient also was prescribed Zofran for nausea.  Patient states that she is persistently nauseated and has some worsening shortness of breath.  Patient had multiple negative Covid tests recently including in the ED 6 days ago.  Denies any fevers or chills .  The history is provided by the patient.       Past Medical History:  Diagnosis Date  . Allergic rhinitis   . Asthma   . DVT (deep venous thrombosis) ([place]) 2010   R leg, treated with coumadin x 6 months  . GERD (gastroesophageal reflux disease)   . Hearing disorder    R ear deafness  . Hyperlipemia   . Kidney calculi    recurrent, lithotripsy  . Meniere's disease   . Multinodular goiter   . Osteoarthritis   . Osteoporosis   . Post-menopausal   . Postural dizziness    postural syncope x 2  . Renal insufficiency   . Rosacea   . 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 Vitamin D deficiency     Patient Active Problem List   Diagnosis Date Noted  . Need for prophylactic vaccination and inoculation against influenza 05/16/2013  . Osteoarthritis 05/16/2013  . Cellulitis of right arm 03/06/2013  . Syncope 02/16/2013  .  Preoperative cardiovascular examination 02/16/2013  . Shortness of breath 02/16/2013  . Sinus tachycardia 02/16/2013  . Unspecified asthma(493.90) 02/08/2013  . Potassium (K) deficiency 02/08/2013  . Meniere's disease 02/08/2013  . Weight gain 02/08/2013  . Unspecified hypothyroidism 02/08/2013  . History of DVT (deep vein thrombosis) 12/20/2011  . H/O renal calculi 12/20/2011  . Personal history of goiter 12/20/2011  . DJD (degenerative joint disease) 12/20/2011  . Other and unspecified hyperlipidemia 12/20/2011  . GERD (gastroesophageal reflux disease) 12/20/2011  . Menopause 12/20/2011  . Allergic rhinitis 12/20/2011    Past Surgical History:  Procedure Laterality Date  . CARDIAC CATHETERIZATION  2005   no CAD by cath at [facility]  . LITHOTRIPSY    . TONSILLECTOMY    . TOTAL ABDOMINAL HYSTERECTOMY W/ BILATERAL SALPINGOOPHORECTOMY  1990     OB History    Gravida  2   Para  2   Term      Preterm      AB      Living  2     SAB      TAB      Ectopic      Multiple      Live Births              Family History  Problem Relation Age of Onset  . Thyroid  disease Father   . Heart disease Father   . Heart disease Mother   . Diabetes Mother   . Cancer Brother        fibrohisteofibroma  . Heart disease Brother   . Thyroid disease Maternal Grandmother     Social History   Tobacco Use  . Smoking status: Never Smoker  . Smokeless tobacco: Never Used  Substance Use Topics  . Alcohol use: No  . Drug use: No    Home Medications Prior to Admission medications   Medication Sig Start Date End Date Taking? Authorizing Provider  albuterol (PROVENTIL HFA;VENTOLIN HFA) 108 (90 BASE) MCG/ACT inhaler USE AS NEEDED    [provider]  aspirin 81 MG chewable tablet Chew 81 mg by mouth daily.      [provider]  Azelaic Acid (FINACEA) 15 % cream Apply topically daily. After skin is thoroughly washed and patted dry, gently but  thoroughly massage a thin film of azelaic acid cream into the affected area twice daily, in the morning and evening.    [provider]  Bacillus Coagulans-Inulin (PROBIOTIC) 1-250 BILLION-MG CAPS Take 1 capsule by mouth daily. 07/02/19   Fawze, Mina A, PA-C  cetirizine (ZYRTEC) 10 MG tablet Take 10 mg by mouth daily.    [provider]  diazepam (VALIUM) 5 MG tablet Take 5 mg by mouth every 6 (six) hours as needed for anxiety.    [provider]  famotidine (PEPCID) 10 MG tablet Take 10 mg by mouth 2 (two) times daily.    [provider]  levothyroxine (SYNTHROID, LEVOTHROID) 50 MCG tablet TAKE 1 TABLET(S) BY MOUTH DAILY 04/05/13   Zanard, Hinton Dyer, MD  Magnesium 500 MG CAPS Take 1 capsule by mouth daily.      [provider]  mometasone (ASMANEX 14 METERED DOSES) 220 MCG/INH inhaler USE I TO 2 PUFF DAILY AT BED TIME    [provider]  mometasone (NASONEX) 50 MCG/ACT nasal spray Place 2 sprays into the nose at bedtime.      [provider]  montelukast (SINGULAIR) 10 MG tablet Take 10 mg by mouth at bedtime.    [provider]  Omeprazole Magnesium (PRILOSEC OTC PO) Take by mouth daily.    [provider]  ondansetron (ZOFRAN ODT) 4 MG disintegrating tablet Take 1 tablet (4 mg total) by mouth every 8 (eight) hours as needed for nausea or vomiting. 07/02/19   Fawze, Mina A, PA-C  potassium chloride (K-DUR) 10 MEQ tablet TAKE 1 TABLET BY MOUTH TWICE DAILY 04/05/13   Zanard, Hinton Dyer, MD  promethazine (PHENERGAN) 12.5 MG suppository Place 12.5 mg rectally every 6 (six) hours as needed for nausea.    [provider]  Riboflavin (VITAMIN B-2 PO) TAKE  200MG  4 TIMES DAILY    [provider]  rosuvastatin (CRESTOR) 40 MG tablet Take 1/2 - 1 tab daily 03/12/13 03/12/14  Zanard, 05/12/14, MD  Vitamin D, Ergocalciferol, (DRISDOL) 50000 UNITS CAPS Take 50,000 Units by mouth every 7 (seven) days. Take on Sunday      [provider]  vitamin E 400 UNIT capsule Take 400 Units by mouth daily.      [provider]    Allergies    Methotrexate derivatives, Lyrica [pregabalin], Other, Prednisone, and Sulfa antibiotics  Review of Systems   Review of Systems  Respiratory: Positive for cough and shortness of breath.   All other systems reviewed and are negative.   Physical Exam  Updated Vital Signs BP (!) 133/45   Pulse (!) 120   Temp 98.1 F (36.7 C) (Oral)   Resp (!) 23   Ht 5' (1.524 m)   Wt 72.6 kg   SpO2 98%   BMI 31.25 kg/m   Physical Exam Vitals and nursing note reviewed.  HENT:     Head: Normocephalic.     Nose: Nose normal.     Mouth/Throat:     Mouth: Mucous membranes are moist.  Eyes:     Extraocular Movements: Extraocular movements intact.     Pupils: Pupils are equal, round, and reactive to light.  Neck:     Comments: Goiter in the neck  Cardiovascular:     Rate and Rhythm: Regular rhythm. Tachycardia present.     Pulses: Normal pulses.     Heart sounds: Normal heart sounds.  Pulmonary:     Effort: Pulmonary effort is normal.     Breath sounds: Normal breath sounds.  Abdominal:     General: Abdomen is flat.     Palpations: Abdomen is soft.  Musculoskeletal:     Cervical back: Normal range of motion.     Comments: R arm IV site appears swollen. No evidence of cellulitis. Good radial pulse   Skin:    General: Skin is warm.  Neurological:     General: No focal deficit present.     Mental Status: She is alert.  Psychiatric:        Mood and Affect: Mood normal.     ED Results / Procedures / Treatments   Labs (all labs ordered are listed, but only abnormal results are displayed) Labs Reviewed  COMPREHENSIVE METABOLIC PANEL - Abnormal; Notable for the following components:      Result Value   CO2 21 (*)    Glucose, Bld 100 (*)    BUN 35 (*)    Creatinine, Ser 1.08 (*)    Total Protein 5.6 (*)    Albumin 2.9 (*)    GFR calc non Af Amer 51 (*)     GFR calc Af Amer 59 (*)    All other components within normal limits  CBC - Abnormal; Notable for the following components:   RBC 3.37 (*)    Hemoglobin 10.7 (*)    HCT 32.2 (*)    Platelets 136 (*)    All other components within normal limits  I-STAT CHEM 8, ED - Abnormal; Notable for the following components:   BUN 33 (*)    Creatinine, Ser 1.10 (*)    Hemoglobin 9.9 (*)    HCT 29.0 (*)    All other components within normal limits  LIPASE, BLOOD  URINALYSIS, ROUTINE W REFLEX MICROSCOPIC  POC SARS CORONAVIRUS 2 AG -  ED  TROPONIN I (HIGH SENSITIVITY)    EKG None  Radiology DG Chest Port 1 View  Result Date: 07/10/2019 CLINICAL DATA:  Sinus infection. Complaining of shortness of breath. EXAM: PORTABLE CHEST 1 VIEW COMPARISON:  Chest radiograph 07/02/2019 FINDINGS: Stable cardiomediastinal contours. Aortic arch calcification. No new focal infiltrate. Eventration of the left hemidiaphragm similar to prior. No pneumothorax or large pleural effusion. No acute finding in the visualized skeleton. IMPRESSION: No evidence of active disease in the chest. Electronically Signed   By: Emmaline Kluver M.D.   On: 07/10/2019 19:44    Procedures Procedures (including critical care time)  Medications Ordered in ED Medications  sodium chloride flush (NS) 0.9 % injection 3 mL (has no administration in time range)  iohexol (OMNIPAQUE) 350 MG/ML injection 100 mL (has no administration in time range)  sodium chloride (PF) 0.9 % injection (has no administration in time range)  sodium chloride 0.9 % bolus 1,000 mL (1,000 mLs Intravenous New Bag/Given 07/10/19 1915)    ED Course  I have reviewed the triage vital signs and the nursing notes.  Pertinent labs & imaging results that were available during my care of the patient were reviewed by me and considered in my medical decision making (see chart for details).    MDM Rules/Calculators/A&P                       Betty Atkinson is a 74 y.o. female  here presenting with palpitations, shortness of breath, nausea.  Patient was recently on antibiotics and was taken off and was thought to be dehydrated .  Patient is tachycardic in the ED and had multiple negative Covid testing.  She did have R arm IV that likely infiltrated but has no signs of cellulitis. Will get labs and CTA chest and CT abdomen pelvis.  10:29 PM CT showed no PE but she does have a known goiter.  Her Covid test is again negative and labs are unremarkable so far.  I talked to her in detail about her goiter.  She states that this is a known problem and she has been follow-up with Dr. Katrinka Blazing at [facility]. I reviewed the chart from recent visit. She was seen on December 10 andHer TSH was 15 and her T3 and T4 were low so she was taken off of methimazole. She is on metoprolol 25 mg twice daily.  She told me that she has very difficult to control thyroid issues and she goes from hyper to hypothyroidism .  I think at this point she is symptomatic hyperthyroidism will get TSH and give propranolol IV .  If her heart rate improves and TSH is low to normal but not undetectable, anticipate discharge home with increase metoprolol to 50 mg twice daily. She has follow up with endocrine next week. Signed out to overnight PA in the ED.   Final Clinical Impression(s) / ED Diagnoses Final diagnoses:  None    Rx / DC Orders ED Discharge Orders    None       Charlynne Pander, MD 07/10/19 2231    Charlynne Pander, MD 07/11/19 [phone]

## 2019-07-10 NOTE — ED Notes (Signed)
Phlebotomy contacted.  

## 2019-07-10 NOTE — ED Triage Notes (Signed)
Pt BIB EMS and coming from home.  Pt reports having a sinus infection.  Pt was put on antibiotics by her PCP.  Pt was began to have n/v d/t antibiotics and was subsequently seen at [facility] on Xmas Eve for evaluation.  Pt was given zofran and told to stop taking her antibiotics but was not given a replacement antibiotic.  Pt is still having symptoms from her sinus infection today. Pt's last blood work showed an elevated BUN and her MD wanted pt to be seen at [facility]. Pt has been unable to take her own medication today d/t loss appetite. Pt a/o x 4 and ambulatory.

## 2019-07-11 LAB — T4, FREE: Free T4: >5.5 ng/dL — ABNORMAL HIGH (ref 0.61–1.12)

## 2019-07-11 MED ORDER — METOPROLOL SUCCINATE ER 50 MG PO TB24: 50.0000 mg | ORAL_TABLET | Freq: Two times a day (BID) | ORAL | 0 refills | Status: DC

## 2019-07-11 NOTE — ED Provider Notes (Signed)
  Physical Exam  BP (!) 156/69   Pulse (!) 112   Temp 98.1 F (36.7 C) (Oral)   Resp 19   Ht 5' (1.524 m)   Wt 72.6 kg   SpO2 99%   BMI 31.25 kg/m   Physical Exam  ED Course/Procedures     Procedures  MDM  Patient's heart rate is noted to be in the low 100s on my reexamination.  Her heart rate has been somewhat bouncing around in the teens to 120s.  When I checked she was at 106.  Patient has been stable thus far and she will have her metoprolol dose adjusted to 50 twice a day.       Charlestine Night, PA-C 07/11/19 0102    Charlynne Pander, MD 07/11/19 [phone]

## 2019-07-12 ENCOUNTER — Emergency Department (HOSPITAL_BASED_OUTPATIENT_CLINIC_OR_DEPARTMENT_OTHER)
Admit: 2019-07-12 | Discharge: 2019-07-12 | Disposition: A | Discharge status: Home / Self Care | Payer: Medicare Other | Attending: Emergency Medicine | Admitting: Emergency Medicine

## 2019-07-12 ENCOUNTER — Emergency Department (HOSPITAL_COMMUNITY): Payer: Medicare Other

## 2019-07-12 ENCOUNTER — Inpatient Hospital Stay (HOSPITAL_COMMUNITY)
Admission: EM | Admit: 2019-07-12 | Discharge: 2019-07-14 | DRG: 644 | Disposition: A | Discharge status: Home Health | Payer: Medicare Other | Attending: Internal Medicine | Admitting: Internal Medicine

## 2019-07-12 ENCOUNTER — Other Ambulatory Visit: Payer: Self-pay

## 2019-07-12 ENCOUNTER — Encounter (HOSPITAL_COMMUNITY): Payer: Self-pay | Admitting: Emergency Medicine

## 2019-07-12 DIAGNOSIS — H9191 Unspecified hearing loss, right ear: Secondary | ICD-10-CM | POA: Diagnosis present

## 2019-07-12 DIAGNOSIS — N183 Chronic kidney disease, stage 3 unspecified: Secondary | ICD-10-CM

## 2019-07-12 DIAGNOSIS — Z882 Allergy status to sulfonamides status: Secondary | ICD-10-CM

## 2019-07-12 DIAGNOSIS — M810 Age-related osteoporosis without current pathological fracture: Secondary | ICD-10-CM | POA: Diagnosis present

## 2019-07-12 DIAGNOSIS — E785 Hyperlipidemia, unspecified: Secondary | ICD-10-CM | POA: Diagnosis present

## 2019-07-12 DIAGNOSIS — E861 Hypovolemia: Secondary | ICD-10-CM | POA: Diagnosis present

## 2019-07-12 DIAGNOSIS — W1830XA Fall on same level, unspecified, initial encounter: Secondary | ICD-10-CM | POA: Diagnosis present

## 2019-07-12 DIAGNOSIS — Y92002 Bathroom of unspecified non-institutional (private) residence single-family (private) house as the place of occurrence of the external cause: Secondary | ICD-10-CM

## 2019-07-12 DIAGNOSIS — D631 Anemia in chronic kidney disease: Secondary | ICD-10-CM | POA: Diagnosis present

## 2019-07-12 DIAGNOSIS — K219 Gastro-esophageal reflux disease without esophagitis: Secondary | ICD-10-CM | POA: Diagnosis present

## 2019-07-12 DIAGNOSIS — W19XXXA Unspecified fall, initial encounter: Secondary | ICD-10-CM

## 2019-07-12 DIAGNOSIS — G2581 Restless legs syndrome: Secondary | ICD-10-CM | POA: Diagnosis present

## 2019-07-12 DIAGNOSIS — Z79890 Hormone replacement therapy (postmenopausal): Secondary | ICD-10-CM | POA: Diagnosis not present

## 2019-07-12 DIAGNOSIS — Z66 Do not resuscitate: Secondary | ICD-10-CM | POA: Diagnosis present

## 2019-07-12 DIAGNOSIS — E0590 Thyrotoxicosis, unspecified without thyrotoxic crisis or storm: Secondary | ICD-10-CM

## 2019-07-12 DIAGNOSIS — K5730 Diverticulosis of large intestine without perforation or abscess without bleeding: Secondary | ICD-10-CM | POA: Diagnosis present

## 2019-07-12 DIAGNOSIS — I82A11 Acute embolism and thrombosis of right axillary vein: Secondary | ICD-10-CM | POA: Diagnosis present

## 2019-07-12 DIAGNOSIS — N1831 Chronic kidney disease, stage 3a: Secondary | ICD-10-CM | POA: Diagnosis present

## 2019-07-12 DIAGNOSIS — S0003XA Contusion of scalp, initial encounter: Secondary | ICD-10-CM | POA: Diagnosis present

## 2019-07-12 DIAGNOSIS — H8109 Meniere's disease, unspecified ear: Secondary | ICD-10-CM | POA: Diagnosis present

## 2019-07-12 DIAGNOSIS — Z8349 Family history of other endocrine, nutritional and metabolic diseases: Secondary | ICD-10-CM

## 2019-07-12 DIAGNOSIS — I82B11 Acute embolism and thrombosis of right subclavian vein: Secondary | ICD-10-CM | POA: Diagnosis present

## 2019-07-12 DIAGNOSIS — E039 Hypothyroidism, unspecified: Secondary | ICD-10-CM | POA: Diagnosis present

## 2019-07-12 DIAGNOSIS — Z888 Allergy status to other drugs, medicaments and biological substances status: Secondary | ICD-10-CM

## 2019-07-12 DIAGNOSIS — Z87442 Personal history of urinary calculi: Secondary | ICD-10-CM

## 2019-07-12 DIAGNOSIS — N1832 Chronic kidney disease, stage 3b: Secondary | ICD-10-CM

## 2019-07-12 DIAGNOSIS — R52 Pain, unspecified: Secondary | ICD-10-CM | POA: Diagnosis not present

## 2019-07-12 DIAGNOSIS — E0520 Thyrotoxicosis with toxic multinodular goiter without thyrotoxic crisis or storm: Principal | ICD-10-CM | POA: Diagnosis present

## 2019-07-12 DIAGNOSIS — I82621 Acute embolism and thrombosis of deep veins of right upper extremity: Secondary | ICD-10-CM | POA: Diagnosis present

## 2019-07-12 DIAGNOSIS — Z7982 Long term (current) use of aspirin: Secondary | ICD-10-CM | POA: Diagnosis not present

## 2019-07-12 DIAGNOSIS — J45909 Unspecified asthma, uncomplicated: Secondary | ICD-10-CM | POA: Diagnosis present

## 2019-07-12 DIAGNOSIS — Z79899 Other long term (current) drug therapy: Secondary | ICD-10-CM

## 2019-07-12 DIAGNOSIS — M7989 Other specified soft tissue disorders: Secondary | ICD-10-CM | POA: Diagnosis not present

## 2019-07-12 DIAGNOSIS — I509 Heart failure, unspecified: Secondary | ICD-10-CM

## 2019-07-12 DIAGNOSIS — I82409 Acute embolism and thrombosis of unspecified deep veins of unspecified lower extremity: Secondary | ICD-10-CM

## 2019-07-12 DIAGNOSIS — E559 Vitamin D deficiency, unspecified: Secondary | ICD-10-CM | POA: Diagnosis present

## 2019-07-12 DIAGNOSIS — Z8639 Personal history of other endocrine, nutritional and metabolic disease: Secondary | ICD-10-CM | POA: Diagnosis not present

## 2019-07-12 DIAGNOSIS — R112 Nausea with vomiting, unspecified: Secondary | ICD-10-CM

## 2019-07-12 DIAGNOSIS — Z87892 Personal history of anaphylaxis: Secondary | ICD-10-CM

## 2019-07-12 DIAGNOSIS — Z86718 Personal history of other venous thrombosis and embolism: Secondary | ICD-10-CM

## 2019-07-12 DIAGNOSIS — M1990 Unspecified osteoarthritis, unspecified site: Secondary | ICD-10-CM | POA: Diagnosis present

## 2019-07-12 DIAGNOSIS — I82611 Acute embolism and thrombosis of superficial veins of right upper extremity: Secondary | ICD-10-CM | POA: Diagnosis present

## 2019-07-12 DIAGNOSIS — Z8249 Family history of ischemic heart disease and other diseases of the circulatory system: Secondary | ICD-10-CM

## 2019-07-12 DIAGNOSIS — Z20822 Contact with and (suspected) exposure to covid-19: Secondary | ICD-10-CM | POA: Diagnosis present

## 2019-07-12 LAB — CBC WITH DIFFERENTIAL/PLATELET
Abs Immature Granulocytes: 0.02 10*3/uL (ref 0.00–0.07)
Basophils Absolute: 0 10*3/uL (ref 0.0–0.1)
Basophils Relative: 0 %
Eosinophils Absolute: 0 10*3/uL (ref 0.0–0.5)
Eosinophils Relative: 0 %
HCT: 34 % — ABNORMAL LOW (ref 36.0–46.0)
Hemoglobin: 10.7 g/dL — ABNORMAL LOW (ref 12.0–15.0)
Immature Granulocytes: 0 %
Lymphocytes Relative: 12 %
Lymphs Abs: 1.1 10*3/uL (ref 0.7–4.0)
MCH: 31.4 pg (ref 26.0–34.0)
MCHC: 31.5 g/dL (ref 30.0–36.0)
MCV: 99.7 fL (ref 80.0–100.0)
Monocytes Absolute: 1.2 10*3/uL — ABNORMAL HIGH (ref 0.1–1.0)
Monocytes Relative: 14 %
Neutro Abs: 6.5 10*3/uL (ref 1.7–7.7)
Neutrophils Relative %: 74 %
Platelets: 206 10*3/uL (ref 150–400)
RBC: 3.41 MIL/uL — ABNORMAL LOW (ref 3.87–5.11)
RDW: 11.4 % — ABNORMAL LOW (ref 11.5–15.5)
WBC: 8.9 10*3/uL (ref 4.0–10.5)
nRBC: 0 % (ref 0.0–0.2)

## 2019-07-12 LAB — COMPREHENSIVE METABOLIC PANEL
ALT: 25 U/L (ref 0–44)
AST: 23 U/L (ref 15–41)
Albumin: 3 g/dL — ABNORMAL LOW (ref 3.5–5.0)
Alkaline Phosphatase: 68 U/L (ref 38–126)
Anion gap: 16 — ABNORMAL HIGH (ref 5–15)
BUN: 38 mg/dL — ABNORMAL HIGH (ref 8–23)
CO2: 18 mmol/L — ABNORMAL LOW (ref 22–32)
Calcium: 9.5 mg/dL (ref 8.9–10.3)
Chloride: 105 mmol/L (ref 98–111)
Creatinine, Ser: 1.17 mg/dL — ABNORMAL HIGH (ref 0.44–1.00)
GFR calc Af Amer: 53 mL/min — ABNORMAL LOW (ref >60)
GFR calc non Af Amer: 46 mL/min — ABNORMAL LOW (ref >60)
Glucose, Bld: 110 mg/dL — ABNORMAL HIGH (ref 70–99)
Potassium: 4.5 mmol/L (ref 3.5–5.1)
Sodium: 139 mmol/L (ref 135–145)
Total Bilirubin: 1.4 mg/dL — ABNORMAL HIGH (ref 0.3–1.2)
Total Protein: 5.9 g/dL — ABNORMAL LOW (ref 6.5–8.1)

## 2019-07-12 LAB — BRAIN NATRIURETIC PEPTIDE: B Natriuretic Peptide: 222.5 pg/mL — ABNORMAL HIGH (ref 0.0–100.0)

## 2019-07-12 LAB — TROPONIN I (HIGH SENSITIVITY): Troponin I (High Sensitivity): 10 ng/L (ref <18)

## 2019-07-12 LAB — MAGNESIUM: Magnesium: 1.9 mg/dL (ref 1.7–2.4)

## 2019-07-12 LAB — PROTIME-INR
INR: 1.3 — ABNORMAL HIGH (ref 0.8–1.2)
Prothrombin Time: 16.2 seconds — ABNORMAL HIGH (ref 11.4–15.2)

## 2019-07-12 MED ORDER — METOPROLOL TARTRATE 25 MG PO TABS
25.0000 mg | ORAL_TABLET | Freq: Once | ORAL | Status: AC
  Administered 2019-07-12: 25 mg via ORAL
  Filled 2019-07-12: qty 1

## 2019-07-12 MED ORDER — METOPROLOL TARTRATE 5 MG/5ML IV SOLN
2.5000 mg | Freq: Once | INTRAVENOUS | Status: AC
  Administered 2019-07-12: 2.5 mg via INTRAVENOUS
  Filled 2019-07-12: qty 5

## 2019-07-12 MED ORDER — SODIUM CHLORIDE 0.9 % IV SOLN: INTRAVENOUS | Status: AC

## 2019-07-12 MED ORDER — FUROSEMIDE 10 MG/ML IJ SOLN
20.0000 mg | Freq: Once | INTRAMUSCULAR | Status: AC
  Administered 2019-07-12: 20 mg via INTRAVENOUS
  Filled 2019-07-12: qty 4

## 2019-07-12 MED ORDER — ONDANSETRON HCL 4 MG/2ML IJ SOLN
4.0000 mg | Freq: Four times a day (QID) | INTRAMUSCULAR | Status: DC | PRN
  Administered 2019-07-12: 4 mg via INTRAVENOUS
  Filled 2019-07-12 (×2): qty 2

## 2019-07-12 MED ORDER — ENOXAPARIN SODIUM 80 MG/0.8ML ~~LOC~~ SOLN
1.0000 mg/kg | Freq: Once | SUBCUTANEOUS | Status: AC
  Administered 2019-07-12: 75 mg via SUBCUTANEOUS
  Filled 2019-07-12: qty 0.75

## 2019-07-12 NOTE — ED Notes (Signed)
Pt placed on purewick 

## 2019-07-12 NOTE — ED Notes (Signed)
[phone] Clent Demark

## 2019-07-12 NOTE — ED Notes (Signed)
Ambulated pt to the restroom and instructed pt to pull the cord when she was finished. Pt started moaning, called for help, and pulled the call bell in the restroom. Morrie Sheldon, NT found the pt sitting on the floor. Pt stated she fell and hit her head on the floor. Used [facility] to transport pt back to her room. Informed Tilden Fossa, MD of the fall.

## 2019-07-12 NOTE — Progress Notes (Signed)
Right upper extremity venous duplex has been completed. Preliminary results can be found in CV Proc through chart review.  Results were given to Dr. Madilyn Hook.  07/12/19 4:38 PM Olen Cordial RVT

## 2019-07-12 NOTE — ED Provider Notes (Signed)
Called bedside for patient assessment after she had a fall in the bathroom. She states that she bent over to pick something up and became dizzy and fell, striking the back of her head. She complains of mild head soreness. On examination she is in no acute distress resting comfortably in the stretcher. She does have a small hematoma to the posterior scalp as well as some mild lower neck tenderness to palpation. She moves all extremities symmetrically. Plan to obtain CT head and neck.   Tilden Fossa, MD 07/12/19 2026

## 2019-07-12 NOTE — ED Provider Notes (Signed)
[facility] Note   CSN: [id number] Arrival date & time: 07/12/19  1513     History No chief complaint on file.   Suzette BattiestSusan Salaam is a 74 y.o. female.  The history is provided by the patient and medical records. No language interpreter was used.   Suzette BattiestSusan Gardin is a 74 y.o. female who presents to the Emergency Department complaining of sob, malaise. She presents to the emergency department complaining of shortness of breath and malaise. She has a history of hypothyroidism, CKD and remote history of DVT, not currently on anticoagulation. She began feeling poorly about three weeks ago and she was treated with antibiotics for sinusitis. Just after initiating the antibiotic she developed significant diarrhea and was evaluated in the ED. Her diarrhea improved after a few days but she developed pain and swelling to her right arm from the IV site. Her right arm pain and swelling has progressed over the last three weeks. About one week ago she began to feel bad again with poor oral intake and malaise. She was seen in the emergency department again two days ago and instructed to increase her metoprolol. She has not been able to contact her doctor and has not yet increased her metoprolol. She is continued her medications as directed. Last night she developed vomiting and diarrhea. Her emesis is described as five episodes of clear emesis and dry heaved with associated five episodes of brown diarrhea. Throughout this illness she has experienced progressive shortness of breath. She denies any fevers, chest pain, abdominal pain, dysuria. Three weeks ago she was taken off of her methimazole due to concern that she is experiencing relative hypothyroidism. She vomited her metoprolol last night but was able to keep the medication down this morning.    Past Medical History:  Diagnosis Date  . Allergic rhinitis   . Asthma   . DVT (deep venous thrombosis) (HCC) 2010   R leg,  treated with coumadin x 6 months  . GERD (gastroesophageal reflux disease)   . Hearing disorder    R ear deafness  . Hyperlipemia   . Kidney calculi    recurrent, lithotripsy  . Meniere's disease   . Multinodular goiter   . Osteoarthritis   . Osteoporosis   . Post-menopausal   . Postural dizziness    postural syncope x 2  . Renal insufficiency   . Rosacea   . 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 Vitamin D deficiency     Patient Active Problem List   Diagnosis Date Noted  . Need for prophylactic vaccination and inoculation against influenza 05/16/2013  . Osteoarthritis 05/16/2013  . Cellulitis of right arm 03/06/2013  . Syncope 02/16/2013  . Preoperative cardiovascular examination 02/16/2013  . Shortness of breath 02/16/2013  . Sinus tachycardia 02/16/2013  . Unspecified asthma(493.90) 02/08/2013  . Potassium (K) deficiency 02/08/2013  . Meniere's disease 02/08/2013  . Weight gain 02/08/2013  . Unspecified hypothyroidism 02/08/2013  . History of DVT (deep vein thrombosis) 12/20/2011  . H/O renal calculi 12/20/2011  . Personal history of goiter 12/20/2011  . DJD (degenerative joint disease) 12/20/2011  . Other and unspecified hyperlipidemia 12/20/2011  . GERD (gastroesophageal reflux disease) 12/20/2011  . Menopause 12/20/2011  . Allergic rhinitis 12/20/2011    Past Surgical History:  Procedure Laterality Date  . CARDIAC CATHETERIZATION  2005   no CAD [facility]  . LITHOTRIPSY    . TONSILLECTOMY    .  TOTAL ABDOMINAL HYSTERECTOMY W/ BILATERAL SALPINGOOPHORECTOMY  1990     OB History    Gravida  2   Para  2   Term      Preterm      AB      Living  2     SAB      TAB      Ectopic      Multiple      Live Births              Family History  Problem Relation Age of Onset  . Thyroid disease Father   . Heart disease Father   . Heart disease Mother   . Diabetes Mother   . Cancer Brother         fibrohisteofibroma  . Heart disease Brother   . Thyroid disease Maternal Grandmother     Social History   Tobacco Use  . Smoking status: Never Smoker  . Smokeless tobacco: Never Used  Substance Use Topics  . Alcohol use: No  . Drug use: No    Home Medications Prior to Admission medications   Medication Sig Start Date End Date Taking? Authorizing Provider  albuterol (PROVENTIL HFA;VENTOLIN HFA) 108 (90 BASE) MCG/ACT inhaler USE AS NEEDED    [provider]  aspirin 81 MG chewable tablet Chew 81 mg by mouth daily.      [provider]  Azelaic Acid (FINACEA) 15 % cream Apply topically daily. After skin is thoroughly washed and patted dry, gently but thoroughly massage a thin film of azelaic acid cream into the affected area twice daily, in the morning and evening.    [provider]  Bacillus Coagulans-Inulin (PROBIOTIC) 1-250 BILLION-MG CAPS Take 1 capsule by mouth daily. 07/02/19   Fawze, Mina A, PA-C  cetirizine (ZYRTEC) 10 MG tablet Take 10 mg by mouth daily.    [provider]  diazepam (VALIUM) 5 MG tablet Take 5 mg by mouth every 6 (six) hours as needed for anxiety.    [provider]  famotidine (PEPCID) 20 MG tablet Take 20 mg by mouth 2 (two) times daily. 06/10/19   [provider]  levothyroxine (SYNTHROID, LEVOTHROID) 50 MCG tablet TAKE 1 TABLET(S) BY MOUTH DAILY Patient taking differently: Take 50 mcg by mouth daily before breakfast.  04/05/13   Zanard, Hinton Dyer, MD  Magnesium 500 MG CAPS Take 1 capsule by mouth daily.      [provider]  metoprolol succinate (TOPROL-XL) 50 MG 24 hr tablet Take 1 tablet (50 mg total) by mouth 2 (two) times daily. 07/11/19   Lawyer, Cristal Deer, PA-C  mometasone (ASMANEX 14 METERED DOSES) 220 MCG/INH inhaler USE I TO 2 PUFF DAILY AT BED TIME    [provider]  mometasone (NASONEX) 50 MCG/ACT nasal spray Place 2 sprays into the nose at bedtime.      [provider]    montelukast (SINGULAIR) 10 MG tablet Take 10 mg by mouth at bedtime.    [provider]  Omeprazole Magnesium (PRILOSEC OTC PO) Take by mouth daily.    [provider]  ondansetron (ZOFRAN ODT) 4 MG disintegrating tablet Take 1 tablet (4 mg total) by mouth every 8 (eight) hours as needed for nausea or vomiting. 07/02/19   Fawze, Mina A, PA-C  potassium chloride (K-DUR) 10 MEQ tablet TAKE 1 TABLET BY MOUTH TWICE DAILY 04/05/13   Zanard, Hinton Dyer, MD  promethazine (PHENERGAN) 12.5 MG suppository Place 12.5 mg rectally every 6 (  six) hours as needed for nausea.    [provider]  Riboflavin (VITAMIN B-2 PO) TAKE  200MG  4 TIMES DAILY    [provider]  rosuvastatin (CRESTOR) 40 MG tablet Take 1/2 - 1 tab daily 03/12/13 07/11/19  Zanard, 09/08/19, MD  Vitamin D, Ergocalciferol, (DRISDOL) 50000 UNITS CAPS Take 50,000 Units by mouth every 7 (seven) days. Take on Sunday     [provider]  vitamin E 400 UNIT capsule Take 400 Units by mouth daily.      [provider]    Allergies    Methotrexate derivatives, Lyrica [pregabalin], Other, Prednisone, and Sulfa antibiotics  Review of Systems   Review of Systems  All other systems reviewed and are negative.   Physical Exam Updated Vital Signs BP 129/61   Pulse (!) 116   Temp 97.7 F (36.5 C) (Oral)   Resp 19   SpO2 97%   Physical Exam Vitals and nursing note reviewed.  Constitutional:      Appearance: She is well-developed.  HENT:     Head: Normocephalic and atraumatic.  Cardiovascular:     Rate and Rhythm: Regular rhythm. Tachycardia present.     Heart sounds: No murmur.  Pulmonary:     Effort: Pulmonary effort is normal. No respiratory distress.     Breath sounds: Normal breath sounds.  Abdominal:     Palpations: Abdomen is soft.     Tenderness: There is no abdominal tenderness. There is no guarding or rebound.  Musculoskeletal:     Comments: 2+ radial pulses bilaterally. There is 2+  pitting edema to the entire right upper extremity, greatest over the ulnar aspect and at the right AC fossa. Flexion extension is intact in the right upper extremity. No significant erythema.  Skin:    General: Skin is warm and dry.  Neurological:     Mental Status: She is alert and oriented to person, place, and time.  Psychiatric:        Behavior: Behavior normal.     ED Results / Procedures / Treatments   Labs (all labs ordered are listed, but only abnormal results are displayed) Labs Reviewed  COMPREHENSIVE METABOLIC PANEL - Abnormal; Notable for the following components:      Result Value   CO2 18 (*)    Glucose, Bld 110 (*)    BUN 38 (*)    Creatinine, Ser 1.17 (*)    Total Protein 5.9 (*)    Albumin 3.0 (*)    Total Bilirubin 1.4 (*)    GFR calc non Af Amer 46 (*)    GFR calc Af Amer 53 (*)    Anion gap 16 (*)    All other components within normal limits  BRAIN NATRIURETIC PEPTIDE - Abnormal; Notable for the following components:   B Natriuretic Peptide 222.5 (*)    All other components within normal limits  CBC WITH DIFFERENTIAL/PLATELET - Abnormal; Notable for the following components:   RBC 3.41 (*)    Hemoglobin 10.7 (*)    HCT 34.0 (*)    RDW 11.4 (*)    Monocytes Absolute 1.2 (*)    All other components within normal limits  PROTIME-INR - Abnormal; Notable for the following components:   Prothrombin Time 16.2 (*)    INR 1.3 (*)    All other components within normal limits  SARS CORONAVIRUS 2 (TAT 6-24 HRS)  MAGNESIUM  TROPONIN I (HIGH SENSITIVITY)    EKG EKG Interpretation  Date/Time:  Sunday  July 12 2019 15:32:13 EST Ventricular Rate:  110 PR Interval:    QRS Duration: 87 QT Interval:  335 QTC Calculation: 884 R Axis:   6 Text Interpretation: Sinus tachycardia Multiple ventricular premature complexes Confirmed by Quintella Reichert [phone]) on 07/12/2019 7:17:29 PM   Radiology DG Chest 2 View  Result Date: 07/12/2019 CLINICAL DATA:  Right arm  pain following prior IV with shortness of breath, initial encounter EXAM: CHEST - 2 VIEW COMPARISON:  07/09/2018 FINDINGS: Cardiac shadow is stable. Aortic calcifications are again seen. The lungs are well aerated bilaterally. No focal infiltrate or sizable effusion is seen. No bony abnormality is noted. IMPRESSION: No active cardiopulmonary disease. Electronically Signed   By: Inez Catalina M.D.   On: 07/12/2019 16:25   CT Angio Chest PE W and/or Wo Contrast  Result Date: 07/10/2019 CLINICAL DATA:  Shortness of breath EXAM: CT ANGIOGRAPHY CHEST WITH CONTRAST TECHNIQUE: Multidetector CT imaging of the chest was performed using the standard protocol during bolus administration of intravenous contrast. Multiplanar CT image reconstructions and MIPs were obtained to evaluate the vascular anatomy. CONTRAST:  39mL OMNIPAQUE IOHEXOL 350 MG/ML SOLN COMPARISON:  Thyroid ultrasound, 01/16/2019 FINDINGS: Cardiovascular: Satisfactory opacification of the pulmonary arteries to the segmental level. Examination for pulmonary embolism is significantly limited by breath motion artifact, particularly in the lung bases. Within this limitation, no evidence of pulmonary embolism through proximal segmental arterial level. Normal heart size. Left coronary artery calcifications. No pericardial effusion. Aortic atherosclerosis. Mediastinum/Nodes: No enlarged mediastinal, hilar, or axillary lymph nodes. Bulky, heterogeneous thyroid goiter. Trachea, and esophagus demonstrate no significant findings. Lungs/Pleura: Mild dependent bibasilar scarring. No pleural effusion or pneumothorax. Upper Abdomen: No acute abnormality. Musculoskeletal: No chest wall abnormality. No acute or significant osseous findings. Review of the MIP images confirms the above findings. IMPRESSION: 1. Examination is significantly limited by breath motion artifact, particularly in the lung bases. Within this limitation, no evidence of pulmonary embolus through proximal  segmental arterial level. 2. Coronary artery disease. 3. Bulky, heterogeneous thyroid goiter, previously evaluated by thyroid ultrasound. 4. Aortic Atherosclerosis (ICD10-I70.0). Electronically Signed   By: Eddie Candle M.D.   On: 07/10/2019 21:23   CT ABDOMEN PELVIS W CONTRAST  Result Date: 07/10/2019 CLINICAL DATA:  Abdominal distension EXAM: CT ABDOMEN AND PELVIS WITH CONTRAST TECHNIQUE: Multidetector CT imaging of the abdomen and pelvis was performed using the standard protocol following bolus administration of intravenous contrast. CONTRAST:  52mL OMNIPAQUE IOHEXOL 350 MG/ML SOLN COMPARISON:  12/19/2015 FINDINGS: Lower chest: No acute abnormality. Hepatobiliary: Focal fatty deposition near the falciform ligament. Status post cholecystectomy. No biliary dilatation. Pancreas: Unremarkable. No pancreatic ductal dilatation or surrounding inflammatory changes. Spleen: Normal in size without significant abnormality. Adrenals/Urinary Tract: Adrenal glands are unremarkable. Kidneys are normal, without renal calculi, solid lesion, or hydronephrosis. Bladder is unremarkable. Stomach/Bowel: Stomach is within normal limits. Appendix is diminutive or surgically absent. No evidence of bowel wall thickening, distention, or inflammatory changes. Sigmoid diverticulosis. Vascular/Lymphatic: Aortic atherosclerosis. No enlarged abdominal or pelvic lymph nodes. Reproductive: Status post cholecystectomy. Other: No abdominal wall hernia or abnormality. No abdominopelvic ascites. Musculoskeletal: No acute or significant osseous findings. IMPRESSION: 1. No acute CT findings of the abdomen or pelvis. 2. Sigmoid diverticulosis without evidence of diverticulitis. 3. Aortic Atherosclerosis (ICD10-I70.0). Electronically Signed   By: Eddie Candle M.D.   On: 07/10/2019 21:27   UE Venous Duplex ([facility] and [facility] ONLY)  Result Date: 07/12/2019 UPPER VENOUS STUDY  Indications: Swelling, and Pain Risk Factors: None identified. Comparison Study: No  prior studies. Performing Technologist: Chanda Busing RVT  Examination Guidelines: A complete evaluation includes B-mode imaging, spectral Doppler, color Doppler, and power Doppler as needed of all accessible portions of each vessel. Bilateral testing is considered an integral part of a complete examination. Limited examinations for reoccurring indications may be performed as noted.  Right Findings: +----------+------------+---------+-----------+----------+-------+ RIGHT     CompressiblePhasicitySpontaneousPropertiesSummary +----------+------------+---------+-----------+----------+-------+ IJV           Full       Yes       Yes                      +----------+------------+---------+-----------+----------+-------+ Subclavian    None       No        No                Acute  +----------+------------+---------+-----------+----------+-------+ Axillary      None       No        No                Acute  +----------+------------+---------+-----------+----------+-------+ Brachial      Full       No        No                       +----------+------------+---------+-----------+----------+-------+ Radial        Full                                          +----------+------------+---------+-----------+----------+-------+ Ulnar         Full                                          +----------+------------+---------+-----------+----------+-------+ Cephalic      None                                   Acute  +----------+------------+---------+-----------+----------+-------+ Basilic       None                                   Acute  +----------+------------+---------+-----------+----------+-------+  Left Findings: +----------+------------+---------+-----------+----------+-------+ LEFT      CompressiblePhasicitySpontaneousPropertiesSummary +----------+------------+---------+-----------+----------+-------+ Subclavian    Full       Yes       Yes                       +----------+------------+---------+-----------+----------+-------+  Summary:  Right: Findings consistent with acute deep vein thrombosis involving the right subclavian vein and right axillary vein. Findings consistent with acute superficial vein thrombosis involving the right basilic vein and right cephalic vein.  Left: No evidence of thrombosis in the subclavian.  *See table(s) above for measurements and observations.  Diagnosing physician: Waverly Ferrari MD Electronically signed by Waverly Ferrari MD on 07/12/2019 at 5:04:17 PM.    Final     Procedures Procedures (including critical care time)  Medications Ordered in ED Medications  metoprolol tartrate (LOPRESSOR) tablet 25 mg (has no administration in time range)  furosemide (LASIX) injection 20 mg (has no administration in time range)  metoprolol tartrate (LOPRESSOR)  injection 2.5 mg (2.5 mg Intravenous Given 07/12/19 1916)  enoxaparin (LOVENOX) injection 75 mg (75 mg Subcutaneous Given 07/12/19 1917)    ED Course  I have reviewed the triage vital signs and the nursing notes.  Pertinent labs & imaging results that were available during my care of the patient were reviewed by me and considered in my medical decision making (see chart for details).    MDM Rules/Calculators/A&P                     Patient here for evaluation of shortness of breath, has a history of hypothyroidism. She was recently evaluated in the emergency department and had a negative for PE study. She does have significant edema to her right upper extremity, vascular ultrasound is significant for right upper extremity DVT. Discussed with vascular surgeon, Dr. Edilia Boickson, who recommends routine management of DVT and no indication for thrombolysis at this time. She also has elevation in her BNP, persistent tachycardia and dyspnea concerning for CHF. She was treated with Lasix for diuresis. Hospitalist consulted for admission for further evaluation and treatment.     Final Clinical Impression(s) / ED Diagnoses Final diagnoses:  Acute deep vein thrombosis (DVT) of axillary vein of right upper extremity (HCC)  Acute congestive heart failure, unspecified heart failure type [facility](HCC)  Hyperthyroidism    Rx / DC Orders ED Discharge Orders    None       Tilden Fossaees, Kali Ambler, MD 07/12/19 1939

## 2019-07-12 NOTE — ED Notes (Signed)
Patient transported to X-ray 

## 2019-07-12 NOTE — H&P (Signed)
History and Physical    Betty Atkinson VFI:[id number] DOB: August 21, 1945 DOA: 07/12/2019  PCP: Albertina Senegal, MD  Patient coming from: Home, lives at home with 75yo mother with dementia  I have personally briefly reviewed patient's old medical records in [facility]  Chief Complaint: Nausea, vomiting and diarrhea  HPI: Betty Atkinson is a 74 y.o. 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  She was diagnosed with sinusitis on 12/21 and was started on Ceftin and later developed nausea, diarrhea and abdominal pain.  She was later evaluated at [facility] on 12/24 and her symptoms were suspected to be likely viral gastroenteritis. She was notably tachycardic in the ED due to not being able to tolerate any of her home medications.  She was treated with a dose of IV metoprolol at that time with improvement.  She then again presented to [facility] ED on 1/1 for symptoms of palpitations, shortness of breath and nausea. She was tachycardic and CTA chest obtained was negative for pulmonary embolism although study was limited by artifact. CT abdomen and pelvis showed no acute findings. Her symptoms thought secondary to recent discontinuation of her methimazole.Patient follows with [facility] outpatient and had her methimazole discontinued after labs on 12/9 showed TSH of 14.145 and Free T4 of 0.4, T3 of 3.37.  She was given IV propranolol in the ED and her metoprolol was increased from 25mg  to 50mg  BID. Patient did not increase her metoprolol upon discharge because she was told to call her PCP on Monday before doing this.   Her symptoms of nausea, vomiting and diarrhea resumed again last night.  She feels extremely  dehydrated and is very weak.  She also suffered a fall while here in the ED when she went to the bathroom and lost her balance when trying to pick up something off the floor.  She denies any dizziness or lightheadedness.  No loss of consciousness.  Her CT head and cervical spine is pending.  Of note she also had IV infiltration to her right arm during one of the ED evaluation and has been having pain.   ED Course: She was afebrile, normotensive, HR up to 120s on room air. WBC of 8.9, hemoglobin of 10.7. Glucose of 110, creatinine of 1.17 from a prior of 1.10 which seems to be around her baseline.  Anion gap of 16.  BNP of 222. Troponin of 10.  Negative CXR   Right UE venous doppler shows acute DVT of the right subclavian and right axillary vein, right basilic vein and right cephalic vein.  She was given 20mg  of Lasix, 2.5mg  of Lopressor IV, 25mg  PO lopressor  And started on Lovenox injection.   ED physician discussed DVT findings with vascular surgeon, Dr. who recommended routine treatment and no indication for thrombolysis.   Review of Systems:  Constitutional: No Weight Change, No Fever ENT/Mouth: No sore throat, No Rhinorrhea Eyes:  No Vision Changes Cardiovascular: No Chest Pain, no SOB Respiratory: No Cough Gastrointestinal: + Nausea, + Vomiting, + Diarrhea, No Constipation, No Pain Genitourinary: no dysuria  Musculoskeletal: No Arthralgias, No Myalgias Skin: No Skin Lesions, No Pruritus, Neuro: + Weakness, No Numbness Psych:  + decrease appetite Heme/Lymph: No Bruising, No Bleeding  Past Medical History:  Diagnosis Date  . Allergic rhinitis   .  Asthma   . DVT (deep venous thrombosis) ([place]) 2010   R leg, treated with coumadin x 6 months  . GERD (gastroesophageal reflux disease)   . Hearing disorder    R ear deafness  . Hyperlipemia   . Kidney calculi    recurrent, lithotripsy  . Meniere's disease   . Multinodular goiter   . Osteoarthritis   . Osteoporosis   .  Post-menopausal   . Postural dizziness    postural syncope x 2  . Renal insufficiency   . Rosacea   . 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 Vitamin D deficiency     Past Surgical History:  Procedure Laterality Date  . CARDIAC CATHETERIZATION  2005   no CAD by cath at [facility]  . LITHOTRIPSY    . TONSILLECTOMY    . TOTAL ABDOMINAL HYSTERECTOMY W/ BILATERAL SALPINGOOPHORECTOMY  1990     reports that she has never smoked. She has never used smokeless tobacco. She reports that she does not drink alcohol or use drugs.  Allergies  Allergen Reactions  . Methotrexate Derivatives Anaphylaxis    Cough, liver failure  . Lyrica [Pregabalin]     Slurred speech, blurred vision, confusion, incohert  . Other     PHENEGAN TABLET : MAKE PATIENT LUPPY PER PT., THUS CONFUSE  . Prednisone Swelling    Hives on face  . Sulfa Antibiotics Diarrhea    indigestion    Family History  Problem Relation Age of Onset  . Thyroid disease Father   . Heart disease Father   . Heart disease Mother   . Diabetes Mother   . Cancer Brother        fibrohisteofibroma  . Heart disease Brother   . Thyroid disease Maternal Grandmother      Prior to Admission medications   Medication Sig Start Date End Date Taking? Authorizing Provider  albuterol (PROVENTIL HFA;VENTOLIN HFA) 108 (90 BASE) MCG/ACT inhaler USE AS NEEDED    [provider]  aspirin 81 MG chewable tablet Chew 81 mg by mouth daily.      [provider]  Azelaic Acid (FINACEA) 15 % cream Apply topically daily. After skin is thoroughly washed and patted dry, gently but thoroughly massage a thin film of azelaic acid cream into the affected area twice daily, in the morning and evening.    [provider]  Bacillus Coagulans-Inulin (PROBIOTIC) 1-250 BILLION-MG CAPS Take 1 capsule by mouth daily. 07/02/19   Fawze, Mina A, PA-C  cetirizine (ZYRTEC) 10 MG tablet Take 10 mg by mouth daily.     [provider]  diazepam (VALIUM) 5 MG tablet Take 5 mg by mouth every 6 (six) hours as needed for anxiety.    [provider]  famotidine (PEPCID) 20 MG tablet Take 20 mg by mouth 2 (two) times daily. 06/10/19   [provider]  levothyroxine (SYNTHROID, LEVOTHROID) 50 MCG tablet TAKE 1 TABLET(S) BY MOUTH DAILY Patient taking differently: Take 50 mcg by mouth daily before breakfast.  04/05/13   Zanard, Bernadene Bell, MD  Magnesium 500 MG CAPS Take 1 capsule by mouth daily.      [provider]  metoprolol succinate (TOPROL-XL) 50 MG 24 hr tablet Take 1 tablet (50 mg total) by mouth 2 (two) times daily. 07/11/19   Lawyer, Harrell Gave, PA-C  mometasone (ASMANEX 14 METERED DOSES) 220 MCG/INH inhaler USE I TO 2 PUFF DAILY AT BED TIME    [provider]  mometasone (NASONEX) 50 MCG/ACT nasal spray Place 2 sprays into the nose at bedtime.      [provider]  montelukast (SINGULAIR) 10 MG tablet Take 10 mg by mouth at bedtime.    [provider]  Omeprazole Magnesium (PRILOSEC OTC PO) Take by mouth daily.    [provider]  ondansetron (ZOFRAN ODT) 4 MG disintegrating tablet Take 1 tablet (4 mg total) by mouth every 8 (eight) hours as needed for nausea or vomiting. 07/02/19   Fawze, Mina A, PA-C  potassium chloride (K-DUR) 10 MEQ tablet TAKE 1 TABLET BY MOUTH TWICE DAILY 04/05/13   Zanard, Hinton Dyerobyn K, MD  promethazine (PHENERGAN) 12.5 MG suppository Place 12.5 mg rectally every 6 (six) hours as needed for nausea.    [provider]  Riboflavin (VITAMIN B-2 PO) TAKE  200MG  4 TIMES DAILY    [provider]  rosuvastatin (CRESTOR) 40 MG tablet Take 1/2 - 1 tab daily 03/12/13 07/11/19  Zanard, Hinton Dyerobyn K, MD  Vitamin D, Ergocalciferol, (DRISDOL) 50000 UNITS CAPS Take 50,000 Units by mouth every 7 (seven) days. Take on Sunday     [provider]  vitamin E 400 UNIT capsule Take 400 Units by mouth daily.      [provider]    Physical Exam: Vitals:   07/12/19 2001 07/12/19 2013 07/12/19 2014 07/12/19 2030  BP:  131/77 131/77 124/66  Pulse:  (!) 115 (!) 119 (!) 115  Resp:   (!) 24 (!) 26  Temp:      TempSrc:      SpO2: 97%  94% 96%    Constitutional: ill appearing female laying comfortably flat in bed Vitals:   07/12/19 2001 07/12/19 2013 07/12/19 2014 07/12/19 2030  BP:  131/77 131/77 124/66  Pulse:  (!) 115 (!) 119 (!) 115  Resp:   (!) 24 (!) 26  Temp:      TempSrc:      SpO2: 97%  94% 96%   Eyes: PERRL, lids and conjunctivae normal ENMT: Mucous membranes are moist.  Neck: normal, supple Respiratory: clear to auscultation bilaterally, no wheezing, no crackles. Normal respiratory effort on room air. No accessory muscle use.  Cardiovascular: Sinus tachycardia, no murmurs / rubs / gallops. Non-pitting edema of bilateral ankle Abdomen: no tenderness, no masses palpated.  Bowel sounds positive.  Musculoskeletal: no clubbing / cyanosis. No joint deformity upper and lower extremities.Bruising noted to right upper arm. Good ROM, no contractures. Normal muscle tone.  Skin: no rashes, lesions, ulcers. No induration Neurologic: CN 2-12 grossly intact. Sensation intact. Strength 4/5 in all 4.  Psychiatric: Normal judgment and insight. Alert and oriented x 3. Normal mood.     Labs on Admission: I have personally reviewed following labs and imaging studies  CBC: Recent Labs  Lab 07/10/19 1959 07/10/19 2010 07/12/19 1713  WBC 5.0  --  8.9  NEUTROABS  --   --  6.5  HGB 10.7* 9.9* 10.7*  HCT 32.2* 29.0* 34.0*  MCV 95.5  --  99.7  PLT 136*  --  206   Basic Metabolic Panel: Recent Labs  Lab 07/10/19 1959 07/10/19 2010 07/12/19 1713  NA 138 137 139  K 4.3 4.3 4.5  CL 105 105 105  CO2 21*  --  18*  GLUCOSE 100* 96 110*  BUN 35* 33* 38*  CREATININE 1.08* 1.10* 1.17*  CALCIUM 9.2  --  9.5  MG  --   --  1.9   GFR: Estimated Creatinine  Clearance: 37.5 mL/min (A) (by C-G formula based on  SCr of 1.17 mg/dL (H)). Liver Function Tests: Recent Labs  Lab 07/10/19 1959 07/12/19 1713  AST 35 23  ALT 34 25  ALKPHOS 63 68  BILITOT 1.0 1.4*  PROT 5.6* 5.9*  ALBUMIN 2.9* 3.0*   Recent Labs  Lab 07/10/19 1959  LIPASE 32   No results for input(s): AMMONIA in the last 168 hours. Coagulation Profile: Recent Labs  Lab 07/12/19 1713  INR 1.3*   Cardiac Enzymes: No results for input(s): CKTOTAL, CKMB, CKMBINDEX, TROPONINI in the last 168 hours. BNP (last 3 results) No results for input(s): PROBNP in the last 8760 hours. HbA1C: No results for input(s): HGBA1C in the last 72 hours. CBG: No results for input(s): GLUCAP in the last 168 hours. Lipid Profile: No results for input(s): CHOL, HDL, LDLCALC, TRIG, CHOLHDL, LDLDIRECT in the last 72 hours. Thyroid Function Tests: Recent Labs    07/10/19 2212  TSH 0.011*  FREET4 >5.50*   Anemia Panel: No results for input(s): VITAMINB12, FOLATE, FERRITIN, TIBC, IRON, RETICCTPCT in the last 72 hours. Urine analysis:    Component Value Date/Time   COLORURINE YELLOW 07/02/2019 1446   APPEARANCEUR CLEAR 07/02/2019 1446   LABSPEC >1.030 (H) 07/02/2019 1446   PHURINE 5.5 07/02/2019 1446   GLUCOSEU NEGATIVE 07/02/2019 1446   HGBUR TRACE (A) 07/02/2019 1446   BILIRUBINUR SMALL (A) 07/02/2019 1446   KETONESUR 15 (A) 07/02/2019 1446   PROTEINUR NEGATIVE 07/02/2019 1446   UROBILINOGEN 0.2 12/19/2014 1615   NITRITE NEGATIVE 07/02/2019 1446   LEUKOCYTESUR NEGATIVE 07/02/2019 1446    Radiological Exams on Admission: DG Chest 2 View  Result Date: 07/12/2019 CLINICAL DATA:  Right arm pain following prior IV with shortness of breath, initial encounter EXAM: CHEST - 2 VIEW COMPARISON:  07/09/2018 FINDINGS: Cardiac shadow is stable. Aortic calcifications are again seen. The lungs are well aerated bilaterally. No focal infiltrate or sizable effusion is seen. No bony abnormality is noted. IMPRESSION: No active cardiopulmonary disease.  Electronically Signed   By: Alcide Clever M.D.   On: 07/12/2019 16:25   UE Venous Duplex ([facility] and [facility] ONLY)  Result Date: 07/12/2019 UPPER VENOUS STUDY  Indications: Swelling, and Pain Risk Factors: None identified. Comparison Study: No prior studies. Performing Technologist: Chanda Busing RVT  Examination Guidelines: A complete evaluation includes B-mode imaging, spectral Doppler, color Doppler, and power Doppler as needed of all accessible portions of each vessel. Bilateral testing is considered an integral part of a complete examination. Limited examinations for reoccurring indications may be performed as noted.  Right Findings: +----------+------------+---------+-----------+----------+-------+ RIGHT     CompressiblePhasicitySpontaneousPropertiesSummary +----------+------------+---------+-----------+----------+-------+ IJV           Full       Yes       Yes                      +----------+------------+---------+-----------+----------+-------+ Subclavian    None       No        No                Acute  +----------+------------+---------+-----------+----------+-------+ Axillary      None       No        No                Acute  +----------+------------+---------+-----------+----------+-------+ Brachial      Full       No        No                       +----------+------------+---------+-----------+----------+-------+  Radial        Full                                          +----------+------------+---------+-----------+----------+-------+ Ulnar         Full                                          +----------+------------+---------+-----------+----------+-------+ Cephalic      None                                   Acute  +----------+------------+---------+-----------+----------+-------+ Basilic       None                                   Acute  +----------+------------+---------+-----------+----------+-------+  Left Findings:  +----------+------------+---------+-----------+----------+-------+ LEFT      CompressiblePhasicitySpontaneousPropertiesSummary +----------+------------+---------+-----------+----------+-------+ Subclavian    Full       Yes       Yes                      +----------+------------+---------+-----------+----------+-------+  Summary:  Right: Findings consistent with acute deep vein thrombosis involving the right subclavian vein and right axillary vein. Findings consistent with acute superficial vein thrombosis involving the right basilic vein and right cephalic vein.  Left: No evidence of thrombosis in the subclavian.  *See table(s) above for measurements and observations.  Diagnosing physician: Waverly Ferrari MD Electronically signed by Waverly Ferrari MD on 07/12/2019 at 5:04:17 PM.    Final     EKG: Independently reviewed.   Assessment/Plan  Nausea/vomiting/diarrhea Suspect this could be largely from issues with her thyroid Had CT abdomen on 1/1 that had no acute findings  Will check C.diff GI pathogen panel, lactoferrin  PRN Zofran   Hyperthyroidism with multinodular goiter Pt recently had Methimazole discontinued by endocrinology due to hypothyroidism  labs on 12/9 showed TSH of 14.145 and Free T4 of 0.4, T3 of 3.37.  However, 1/1 labs- TSH of 0.011, Free T4 of 5.50, T3 still pending- now shows again hyperthyroidism  Will hold levothyroxine  Switch metoprolol to po propanolol 20mg  TID   Right UE DVT  Right UE venous doppler shows acute DVT of the right subclavian and right axillary vein, right basilic vein and right cephalic vein. Lovenox injection BID  Elevated anion gap  16 on admit Due to decrease PO intake and hypovolemia Monitor after IV fluid  Mechanical fall Had fall in the ED while bending down to pick something up Negative CT head and cervical spine  Anemia  10.7 on admit Check iron panel, vitamin B12 and folic   CKD stage 3 Stable  creatinine  GERD  Continue famotidine and PPI  Asthma Not in acute exacerbation Continue bronchodilator  DVT prophylaxis:.Lovenox Code Status:DNR Family Communication: Plan discussed with patient at bedside  disposition Plan: Home with at least 2 midnight stays  Consults called:  Admission status: inpatient   Aubreigh Fuerte T Cristofher Livecchi DO [facility]   If 7PM-7AM, please contact night-coverage [email or web address] Password [email or web address]  07/12/2019, 9:08 PM

## 2019-07-12 NOTE — ED Notes (Signed)
While at nurses station, pt was heard moaning from the bathroom and then she pulled the cord for assistance.  I then heard the pt yell "help!" so I went in to the restroom and found pt sitting on the floor.  Pt advised that she had fallen backward and hit her head on the floor. RN Garner Gavel came to assist.  Pt was assisted with getting up from the floor and a steady was used to get pt back to room.  Pt was assisted with getting back into the bed.

## 2019-07-12 NOTE — ED Triage Notes (Signed)
Per pt, states she was recently discharge from hospital-states she went home and has'nt gotten any better-states increased SOB-right arm swollen

## 2019-07-13 DIAGNOSIS — E0590 Thyrotoxicosis, unspecified without thyrotoxic crisis or storm: Secondary | ICD-10-CM

## 2019-07-13 DIAGNOSIS — I82409 Acute embolism and thrombosis of unspecified deep veins of unspecified lower extremity: Secondary | ICD-10-CM

## 2019-07-13 DIAGNOSIS — N183 Chronic kidney disease, stage 3 unspecified: Secondary | ICD-10-CM

## 2019-07-13 DIAGNOSIS — W19XXXA Unspecified fall, initial encounter: Secondary | ICD-10-CM

## 2019-07-13 DIAGNOSIS — N1832 Chronic kidney disease, stage 3b: Secondary | ICD-10-CM

## 2019-07-13 LAB — BASIC METABOLIC PANEL
Anion gap: 13 (ref 5–15)
BUN: 38 mg/dL — ABNORMAL HIGH (ref 8–23)
CO2: 21 mmol/L — ABNORMAL LOW (ref 22–32)
Calcium: 8.9 mg/dL (ref 8.9–10.3)
Chloride: 107 mmol/L (ref 98–111)
Creatinine, Ser: 1.1 mg/dL — ABNORMAL HIGH (ref 0.44–1.00)
GFR calc Af Amer: 57 mL/min — ABNORMAL LOW (ref >60)
GFR calc non Af Amer: 49 mL/min — ABNORMAL LOW (ref >60)
Glucose, Bld: 88 mg/dL (ref 70–99)
Potassium: 3.9 mmol/L (ref 3.5–5.1)
Sodium: 141 mmol/L (ref 135–145)

## 2019-07-13 LAB — CBC
HCT: 30.7 % — ABNORMAL LOW (ref 36.0–46.0)
Hemoglobin: 9.9 g/dL — ABNORMAL LOW (ref 12.0–15.0)
MCH: 31.7 pg (ref 26.0–34.0)
MCHC: 32.2 g/dL (ref 30.0–36.0)
MCV: 98.4 fL (ref 80.0–100.0)
Platelets: 185 10*3/uL (ref 150–400)
RBC: 3.12 MIL/uL — ABNORMAL LOW (ref 3.87–5.11)
RDW: 11.6 % (ref 11.5–15.5)
WBC: 7.1 10*3/uL (ref 4.0–10.5)
nRBC: 0 % (ref 0.0–0.2)

## 2019-07-13 LAB — SARS CORONAVIRUS 2 (TAT 6-24 HRS): SARS Coronavirus 2: NEGATIVE

## 2019-07-13 LAB — T3: T3, Total: 432 ng/dL — ABNORMAL HIGH (ref 71–180)

## 2019-07-13 MED ORDER — METHIMAZOLE 10 MG PO TABS: 20.0000 mg | ORAL_TABLET | Freq: Every day | ORAL | Status: DC

## 2019-07-13 MED ORDER — APIXABAN 5 MG PO TABS: 5.0000 mg | ORAL_TABLET | Freq: Two times a day (BID) | ORAL | Status: DC

## 2019-07-13 MED ORDER — APIXABAN 5 MG PO TABS
10.0000 mg | ORAL_TABLET | Freq: Two times a day (BID) | ORAL | Status: DC
  Administered 2019-07-13 – 2019-07-14 (×2): 10 mg via ORAL
  Filled 2019-07-13 (×2): qty 2

## 2019-07-13 MED ORDER — METHIMAZOLE 10 MG PO TABS
10.0000 mg | ORAL_TABLET | Freq: Two times a day (BID) | ORAL | Status: DC
  Administered 2019-07-13 – 2019-07-14 (×3): 10 mg via ORAL
  Filled 2019-07-13 (×4): qty 1

## 2019-07-13 MED ORDER — ACETAMINOPHEN 325 MG PO TABS
650.0000 mg | ORAL_TABLET | Freq: Four times a day (QID) | ORAL | Status: DC | PRN
  Administered 2019-07-13 – 2019-07-14 (×3): 650 mg via ORAL
  Filled 2019-07-13 (×3): qty 2

## 2019-07-13 MED ORDER — METHIMAZOLE 10 MG PO TABS
10.0000 mg | ORAL_TABLET | Freq: Every day | ORAL | Status: DC
  Filled 2019-07-13: qty 1

## 2019-07-13 MED ORDER — METOPROLOL SUCCINATE ER 50 MG PO TB24
50.0000 mg | ORAL_TABLET | Freq: Every day | ORAL | Status: DC
  Administered 2019-07-14: 11:00:00 50 mg via ORAL
  Filled 2019-07-13: qty 1

## 2019-07-13 MED ORDER — ASPIRIN 81 MG PO CHEW
81.0000 mg | CHEWABLE_TABLET | Freq: Every day | ORAL | Status: DC
  Administered 2019-07-13: 81 mg via ORAL
  Filled 2019-07-13: qty 1

## 2019-07-13 MED ORDER — ROSUVASTATIN CALCIUM 20 MG PO TABS
20.0000 mg | ORAL_TABLET | Freq: Every day | ORAL | Status: DC
  Administered 2019-07-13 – 2019-07-14 (×2): 20 mg via ORAL
  Filled 2019-07-13 (×2): qty 1

## 2019-07-13 MED ORDER — LORATADINE 10 MG PO TABS
10.0000 mg | ORAL_TABLET | Freq: Every day | ORAL | Status: DC
  Administered 2019-07-13 – 2019-07-14 (×2): 10 mg via ORAL
  Filled 2019-07-13 (×2): qty 1

## 2019-07-13 MED ORDER — POTASSIUM CHLORIDE CRYS ER 10 MEQ PO TBCR
10.0000 meq | EXTENDED_RELEASE_TABLET | Freq: Two times a day (BID) | ORAL | Status: DC
  Administered 2019-07-13 – 2019-07-14 (×3): 10 meq via ORAL
  Filled 2019-07-13 (×4): qty 1

## 2019-07-13 MED ORDER — BUDESONIDE 0.25 MG/2ML IN SUSP
0.2500 mg | Freq: Two times a day (BID) | RESPIRATORY_TRACT | Status: DC
  Administered 2019-07-13 – 2019-07-14 (×3): 0.25 mg via RESPIRATORY_TRACT
  Filled 2019-07-13 (×3): qty 2

## 2019-07-13 MED ORDER — FAMOTIDINE 20 MG PO TABS
20.0000 mg | ORAL_TABLET | Freq: Every day | ORAL | Status: DC
  Administered 2019-07-13 (×2): 20 mg via ORAL
  Filled 2019-07-13 (×2): qty 1

## 2019-07-13 MED ORDER — MOMETASONE FUROATE 220 MCG/INH IN AEPB: 1.0000 | INHALATION_SPRAY | Freq: Every day | RESPIRATORY_TRACT | Status: DC

## 2019-07-13 MED ORDER — MAGNESIUM OXIDE 400 (241.3 MG) MG PO TABS
400.0000 mg | ORAL_TABLET | Freq: Every day | ORAL | Status: DC
  Administered 2019-07-13 – 2019-07-14 (×2): 400 mg via ORAL
  Filled 2019-07-13 (×2): qty 1

## 2019-07-13 MED ORDER — PROPRANOLOL HCL 20 MG PO TABS
20.0000 mg | ORAL_TABLET | Freq: Three times a day (TID) | ORAL | Status: DC
  Administered 2019-07-13: 20 mg via ORAL
  Filled 2019-07-13: qty 1

## 2019-07-13 MED ORDER — PANTOPRAZOLE SODIUM 40 MG PO TBEC
40.0000 mg | DELAYED_RELEASE_TABLET | Freq: Every day | ORAL | Status: DC
  Administered 2019-07-13 – 2019-07-14 (×2): 40 mg via ORAL
  Filled 2019-07-13 (×2): qty 1

## 2019-07-13 MED ORDER — MONTELUKAST SODIUM 10 MG PO TABS
10.0000 mg | ORAL_TABLET | Freq: Every day | ORAL | Status: DC
  Administered 2019-07-13 (×2): 10 mg via ORAL
  Filled 2019-07-13 (×2): qty 1

## 2019-07-13 MED ORDER — DIAZEPAM 5 MG PO TABS: 5.0000 mg | ORAL_TABLET | Freq: Four times a day (QID) | ORAL | Status: DC | PRN

## 2019-07-13 MED ORDER — ENOXAPARIN SODIUM 80 MG/0.8ML ~~LOC~~ SOLN
70.0000 mg | Freq: Two times a day (BID) | SUBCUTANEOUS | Status: DC
  Administered 2019-07-13: 70 mg via SUBCUTANEOUS
  Filled 2019-07-13 (×2): qty 0.7

## 2019-07-13 MED ORDER — METOPROLOL SUCCINATE ER 100 MG PO TB24: 100.0000 mg | ORAL_TABLET | Freq: Two times a day (BID) | ORAL | Status: DC

## 2019-07-13 MED ORDER — METOPROLOL SUCCINATE ER 100 MG PO TB24
100.0000 mg | ORAL_TABLET | Freq: Every day | ORAL | Status: DC
  Administered 2019-07-13: 100 mg via ORAL
  Filled 2019-07-13: qty 1

## 2019-07-13 NOTE — ED Notes (Signed)
Eating breakfast at present time. Will give neb treatment after eating

## 2019-07-13 NOTE — Progress Notes (Signed)
ANTICOAGULATION CONSULT NOTE  Pharmacy Consult for Apixaban Indication: DVT  Allergies  Allergen Reactions  . Methotrexate Derivatives Anaphylaxis    Cough, liver failure  . Lyrica [Pregabalin]     Slurred speech, blurred vision, confusion, incohert  . Other     PHENEGAN TABLET : MAKE PATIENT LUPPY PER PT., THUS CONFUSE  . Prednisone Swelling    Hives on face  . Sulfa Antibiotics Diarrhea    indigestion    Patient Measurements:   Height: 5' Weight: 72.6 kg  Vital Signs: BP: 131/68 (01/04 1500) Pulse Rate: 97 (01/04 1500)  Labs: Recent Labs    07/10/19 1959 07/10/19 2010 07/10/19 2212 07/12/19 1713 07/13/19 0631  HGB 10.7* 9.9*  --  10.7* 9.9*  HCT 32.2* 29.0*  --  34.0* 30.7*  PLT 136*  --   --  206 185  LABPROT  --   --   --  16.2*  --   INR  --   --   --  1.3*  --   CREATININE 1.08* 1.10*  --  1.17* 1.10*  TROPONINIHS 9  --  10 10  --     Estimated Creatinine Clearance: 39.9 mL/min (A) (by C-G formula based on SCr of 1.1 mg/dL (H)).   Medical History: Past Medical History:  Diagnosis Date  . Allergic rhinitis   . Asthma   . DVT (deep venous thrombosis) ([facility]) 2010   R leg, treated with coumadin x 6 months  . GERD (gastroesophageal reflux disease)   . Hearing disorder    R ear deafness  . Hyperlipemia   . Kidney calculi    recurrent, lithotripsy  . Meniere's disease   . Multinodular goiter   . Osteoarthritis   . Osteoporosis   . Post-menopausal   . Postural dizziness    postural syncope x 2  . Renal insufficiency   . Rosacea   . 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 Vitamin D deficiency     Assessment: 8 y/oF who presented to [facility] ED on 07/12/2019 with persistent nausea, vomiting, and diarrhea. CTa chest on 07/10/2019 negative for PE. Upper extremity venous Doppler on 07/12/2019 showed acute RUE DVT. Patient was initially started on Enoxaparin. Pharmacy consulted to transition patient to Apixaban. Last dose of Enoxaparin 1mg /kg  was this AM at 1009. Hgb 9.9, Pltc WNL.   Goal of Therapy:  Treatment of DVT   Plan:   Start Apixaban 10mg  PO BID x 7 days at 2200 tonight, followed by 5mg  PO BID thereafter  Monitor CBC, s/sx of bleeding  Pharmacy to provide education prior to discharge   07/13/2019,3:05 PM

## 2019-07-13 NOTE — ED Notes (Signed)
Report given to Junious Dresser, RN No questions at this time Junious Dresser, RN made aware that patient is on enteric precautions but has not had any bowel movements for CDIFF sample.

## 2019-07-13 NOTE — ED Notes (Signed)
ED TO INPATIENT HANDOFF REPORT  ED Nurse Name and Phone #: [phone] Betty Atkinson  S Name/Age/Gender Betty Atkinson 74 y.o. female Room/Bed: WA19/WA19  Code Status   Code Status: DNR  Home/SNF/Other Home Patient oriented to: self Is this baseline? Yes   Triage Complete: Triage complete  Chief Complaint Intractable vomiting with nausea [R11.2]  Triage Note Per pt, states she was recently discharge from hospital-states she went home and has'nt gotten any better-states increased SOB-right arm swollen    Allergies Allergies  Allergen Reactions  . Methotrexate Derivatives Anaphylaxis    Cough, liver failure  . Lyrica [Pregabalin]     Slurred speech, blurred vision, confusion, incohert  . Other     PHENEGAN TABLET : MAKE PATIENT LUPPY PER PT., THUS CONFUSE  . Prednisone Swelling    Hives on face  . Sulfa Antibiotics Diarrhea    indigestion    Level of Care/Admitting Diagnosis ED Disposition    ED Disposition Condition Comment   Admit  Hospital Area: [facility] [[id number]]  Level of Care: Telemetry [5]  Admit to tele based on following criteria: Other see comments  Comments: tachycardia  Covid Evaluation: Asymptomatic Screening Protocol (No Symptoms)  Diagnosis: Intractable vomiting with nausea [[id number]]  Admitting Physician: Anselm Jungling [[id number]]  Attending Physician: Anselm Jungling [[id number]]  Estimated length of stay: past midnight tomorrow  Certification:: I certify this patient will need inpatient services for at least 2 midnights       B Medical/Surgery History Past Medical History:  Diagnosis Date  . Allergic rhinitis   . Asthma   . DVT (deep venous thrombosis) (HCC) 2010   R leg, treated with coumadin x 6 months  . GERD (gastroesophageal reflux disease)   . Hearing disorder    R ear deafness  . Hyperlipemia   . Kidney calculi    recurrent, lithotripsy  . Meniere's disease   . Multinodular goiter   . Osteoarthritis   . Osteoporosis   .  Post-menopausal   . Postural dizziness    postural syncope x 2  . Renal insufficiency   . Rosacea   . 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 Vitamin D deficiency    Past Surgical History:  Procedure Laterality Date  . CARDIAC CATHETERIZATION  2005   no CAD by cath at [facility]  . LITHOTRIPSY    . TONSILLECTOMY    . TOTAL ABDOMINAL HYSTERECTOMY W/ BILATERAL SALPINGOOPHORECTOMY  1990     A IV Location/Drains/Wounds Patient Lines/Drains/Airways Status   Active Line/Drains/Airways    Name:   Placement date:   Placement time:   Site:   Days:   Peripheral IV 07/12/19 Left;Upper;Lateral Forearm   07/12/19    1714    Forearm   1          Intake/Output Last 24 hours  Intake/Output Summary (Last 24 hours) at 07/13/2019 2249 Last data filed at 07/13/2019 2003 Gross per 24 hour  Intake 0 ml  Output 700 ml  Net -700 ml    Labs/Imaging Results for orders placed or performed during the hospital encounter of 07/12/19 (from the past 48 hour(s))  Comprehensive metabolic panel     Status: Abnormal   Collection Time: 07/12/19  5:13 PM  Result Value Ref Range   Sodium 139 135 - 145 mmol/L   Potassium 4.5 3.5 - 5.1 mmol/L   Chloride 105 98 - 111 mmol/L   CO2 18 (L) 22 - 32  mmol/L   Glucose, Bld 110 (H) 70 - 99 mg/dL   BUN 38 (H) 8 - 23 mg/dL   Creatinine, Ser 9.38 (H) 0.44 - 1.00 mg/dL   Calcium 9.5 8.9 - 18.2 mg/dL   Total Protein 5.9 (L) 6.5 - 8.1 g/dL   Albumin 3.0 (L) 3.5 - 5.0 g/dL   AST 23 15 - 41 U/L   ALT 25 0 - 44 U/L   Alkaline Phosphatase 68 38 - 126 U/L   Total Bilirubin 1.4 (H) 0.3 - 1.2 mg/dL   GFR calc non Af Amer 46 (L) >60 mL/min   GFR calc Af Amer 53 (L) >60 mL/min   Anion gap 16 (H) 5 - 15    Comment: Performed at [facility], [address], [place], [place] [address]  Brain natriuretic peptide     Status: Abnormal   Collection Time: 07/12/19  5:13 PM  Result Value Ref Range   B Natriuretic Peptide 222.5 (H)  0.0 - 100.0 pg/mL    Comment: Performed at [facility], [address] [place], [place] [address]  CBC with Differential     Status: Abnormal   Collection Time: 07/12/19  5:13 PM  Result Value Ref Range   WBC 8.9 4.0 - 10.5 K/uL   RBC 3.41 (L) 3.87 - 5.11 MIL/uL   Hemoglobin 10.7 (L) 12.0 - 15.0 g/dL   HCT 93.8 (L) 10.1 - 75.1 %   MCV 99.7 80.0 - 100.0 fL   MCH 31.4 26.0 - 34.0 pg   MCHC 31.5 30.0 - 36.0 g/dL   RDW 02.5 (L) 85.2 - 77.8 %   Platelets 206 150 - 400 K/uL   nRBC 0.0 0.0 - 0.2 %   Neutrophils Relative % 74 %   Neutro Abs 6.5 1.7 - 7.7 K/uL   Lymphocytes Relative 12 %   Lymphs Abs 1.1 0.7 - 4.0 K/uL   Monocytes Relative 14 %   Monocytes Absolute 1.2 (H) 0.1 - 1.0 K/uL   Eosinophils Relative 0 %   Eosinophils Absolute 0.0 0.0 - 0.5 K/uL   Basophils Relative 0 %   Basophils Absolute 0.0 0.0 - 0.1 K/uL   Immature Granulocytes 0 %   Abs Immature Granulocytes 0.02 0.00 - 0.07 K/uL    Comment: Performed at [facility], [address], [place], [place] [address]  Troponin I (High Sensitivity)     Status: None   Collection Time: 07/12/19  5:13 PM  Result Value Ref Range   Troponin I (High Sensitivity) 10 <18 ng/L    Comment: (NOTE) Elevated high sensitivity troponin I (hsTnI) values and significant  changes across serial measurements may suggest ACS but many other  chronic and acute conditions are known to elevate hsTnI results.  Refer to the "Links" section for chest pain algorithms and additional  guidance. Performed at [facility], [address] [place], [place] [address]   Magnesium     Status: None   Collection Time: 07/12/19  5:13 PM  Result Value Ref Range   Magnesium 1.9 1.7 - 2.4 mg/dL    Comment: Performed at [facility], [address], [place], [place] [address]  Protime-INR     Status: Abnormal   Collection Time: 07/12/19  5:13 PM  Result Value Ref Range   Prothrombin  Time 16.2 (H) 11.4 - 15.2 seconds   INR 1.3 (H) 0.8 - 1.2    Comment: (NOTE) INR goal varies based  on device and disease states. Performed at [facility], [address] [place], [place] [address]   SARS CORONAVIRUS 2 (TAT 6-24 HRS) Nasopharyngeal Nasopharyngeal Swab     Status: None   Collection Time: 07/12/19  8:04 PM   Specimen: Nasopharyngeal Swab  Result Value Ref Range   SARS Coronavirus 2 NEGATIVE NEGATIVE    Comment: (NOTE) SARS-CoV-2 target nucleic acids are NOT DETECTED. The SARS-CoV-2 RNA is generally detectable in upper and lower respiratory specimens during the acute phase of infection. Negative results do not preclude SARS-CoV-2 infection, do not rule out co-infections with other pathogens, and should not be used as the sole basis for treatment or other patient management decisions. Negative results must be combined with clinical observations, patient history, and epidemiological information. The expected result is Negative. Fact Sheet for Patients: HairSlick.nohttps://www.fda.gov/media/138098/download Fact Sheet for Healthcare Providers: quierodirigir.comhttps://www.fda.gov/media/138095/download This test is not yet approved or cleared by the [place] FDA and  has been authorized for detection and/or diagnosis of SARS-CoV-2 by FDA under an Emergency Use Authorization (EUA). This EUA will remain  in effect (meaning this test can be used) for the duration of the COVID-19 declaration under Section 56 4(b)(1) of the Act, 21 U.S.C. section 360bbb-3(b)(1), unless the authorization is terminated or revoked sooner. Performed at [facility], [address] [place], [place] [address]   Basic metabolic panel     Status: Abnormal   Collection Time: 07/13/19  6:31 AM  Result Value Ref Range   Sodium 141 135 - 145 mmol/L   Potassium 3.9 3.5 - 5.1 mmol/L   Chloride 107 98 - 111 mmol/L   CO2 21 (L) 22 - 32 mmol/L   Glucose, Bld 88 70 - 99 mg/dL   BUN 38 (H) 8 - 23  mg/dL   Creatinine, Ser 3.081.10 (H) 0.44 - 1.00 mg/dL   Calcium 8.9 8.9 - 65.710.3 mg/dL   GFR calc non Af Amer 49 (L) >60 mL/min   GFR calc Af Amer 57 (L) >60 mL/min   Anion gap 13 5 - 15    Comment: Performed at [facility], [address] [place], [place] [address]  CBC     Status: Abnormal   Collection Time: 07/13/19  6:31 AM  Result Value Ref Range   WBC 7.1 4.0 - 10.5 K/uL   RBC 3.12 (L) 3.87 - 5.11 MIL/uL   Hemoglobin 9.9 (L) 12.0 - 15.0 g/dL   HCT 29.530.7 (L) 28.436.0 - 13.246.0 %   MCV 98.4 80.0 - 100.0 fL   MCH 31.7 26.0 - 34.0 pg   MCHC 32.2 30.0 - 36.0 g/dL   RDW 44.011.6 10.211.5 - 72.515.5 %   Platelets 185 150 - 400 K/uL   nRBC 0.0 0.0 - 0.2 %    Comment: Performed at [facility], [address] [place], [place] [address]   DG Chest 2 View  Result Date: 07/12/2019 CLINICAL DATA:  Right arm pain following prior IV with shortness of breath, initial encounter EXAM: CHEST - 2 VIEW COMPARISON:  07/09/2018 FINDINGS: Cardiac shadow is stable. Aortic calcifications are again seen. The lungs are well aerated bilaterally. No focal infiltrate or sizable effusion is seen. No bony abnormality is noted. IMPRESSION: No active cardiopulmonary disease. Electronically Signed   By: Alcide CleverMark  Lukens M.D.   On: 07/12/2019 16:25   CT Head Wo Contrast  Result Date: 07/12/2019 CLINICAL DATA:  Fall hitting head. Recently started Lovenox EXAM: CT HEAD WITHOUT CONTRAST  TECHNIQUE: Contiguous axial images were obtained from the base of the skull through the vertex without intravenous contrast. COMPARISON:  Head CT 02/02/2010 FINDINGS: Brain: No intracranial hemorrhage, mass effect, or midline shift. Brain volume is normal for age. No hydrocephalus. The basilar cisterns are patent. No evidence of territorial infarct or acute ischemia. No extra-axial or intracranial fluid collection. Vascular: Atherosclerosis of skullbase vasculature without hyperdense vessel or abnormal calcification. Skull: No  fracture or focal lesion. Sinuses/Orbits: Paranasal sinuses and mastoid air cells are clear. The visualized orbits are unremarkable. Other: None. IMPRESSION: No acute intracranial abnormality. No skull fracture. Electronically Signed   By: Narda Rutherford M.D.   On: 07/12/2019 22:01   CT Cervical Spine Wo Contrast  Result Date: 07/12/2019 CLINICAL DATA:  Fall with posterior neck pain. EXAM: CT CERVICAL SPINE WITHOUT CONTRAST TECHNIQUE: Multidetector CT imaging of the cervical spine was performed without intravenous contrast. Multiplanar CT image reconstructions were also generated. COMPARISON:  None. FINDINGS: Alignment: Slight levo scoliotic curvature of the lower cervical spine. No traumatic subluxation. Skull base and vertebrae: No acute fracture. 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 Vertebral body heights are maintained. The dens and skull base are intact. Soft tissues and spinal canal: No prevertebral fluid or swelling. No visible canal hematoma. Disc levels: Congenitally small C2-C3 disc space. Disc space narrowing and endplate spurring at C5-C6 through C7-T1, mild. Mild scattered facet hypertrophy. Upper chest: Heterogeneous enlarged thyroid gland, previously evaluated with ultrasound. No acute findings. Other: None. IMPRESSION: 1. Mild degenerative change in the cervical spine without acute fracture or subluxation. 2. 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 Electronically Signed   By: Narda Rutherford M.D.   On: 07/12/2019 22:05   UE Venous Duplex ([facility] and [facility] ONLY)  Result Date: 07/12/2019 UPPER VENOUS STUDY  Indications: Swelling, and Pain Risk Factors: None identified. Comparison Study: No prior studies. Performing Technologist: Chanda Busing RVT  Examination Guidelines: A complete evaluation includes B-mode imaging, spectral Doppler, color Doppler, and power Doppler as needed of all accessible portions of each  vessel. Bilateral testing is considered an integral part of a complete examination. Limited examinations for reoccurring indications may be performed as noted.  Right Findings: +----------+------------+---------+-----------+----------+-------+ RIGHT     CompressiblePhasicitySpontaneousPropertiesSummary +----------+------------+---------+-----------+----------+-------+ IJV           Full       Yes       Yes                      +----------+------------+---------+-----------+----------+-------+ Subclavian    None       No        No                Acute  +----------+------------+---------+-----------+----------+-------+ Axillary      None       No        No                Acute  +----------+------------+---------+-----------+----------+-------+ Brachial      Full       No        No                       +----------+------------+---------+-----------+----------+-------+ Radial        Full                                          +----------+------------+---------+-----------+----------+-------+  Ulnar         Full                                          +----------+------------+---------+-----------+----------+-------+ Cephalic      None                                   Acute  +----------+------------+---------+-----------+----------+-------+ Basilic       None                                   Acute  +----------+------------+---------+-----------+----------+-------+  Left Findings: +----------+------------+---------+-----------+----------+-------+ LEFT      CompressiblePhasicitySpontaneousPropertiesSummary +----------+------------+---------+-----------+----------+-------+ Subclavian    Full       Yes       Yes                      +----------+------------+---------+-----------+----------+-------+  Summary:  Right: Findings consistent with acute deep vein thrombosis involving the right subclavian vein and right axillary vein. Findings  consistent with acute superficial vein thrombosis involving the right basilic vein and right cephalic vein.  Left: No evidence of thrombosis in the subclavian.  *See table(s) above for measurements and observations.  Diagnosing physician: Waverly Ferrarihristopher Dickson MD Electronically signed by Waverly Ferrarihristopher Dickson MD on 07/12/2019 at 5:04:17 PM.    Final     Pending Labs Unresulted Labs (From admission, onward)    Start     Ordered   07/14/19 0500  CBC  Tomorrow morning,   R     07/13/19 0850   07/12/19 2125  GI pathogen panel by PCR, stool  (Gastrointestinal Panel by PCR, Stool                                                                                                                                                     *Does Not include CLOSTRIDIUM DIFFICILE testing.**If CDIFF testing is needed, select the C Difficile Quick Screen w PCR reflex order below)  Once,   STAT     07/12/19 2124   07/12/19 2107  C difficile quick scan w PCR reflex  (C Difficile quick screen w PCR reflex panel)  Once, for 24 hours,   STAT     07/12/19 2107          Vitals/Pain Today's Vitals   07/13/19 2100 07/13/19 2130 07/13/19 2200 07/13/19 2230  BP: 126/70 127/72 130/68 118/63  Pulse: 81 81 83 81  Resp: (!) 25 (!) 26 (!) 22 15  Temp:      TempSrc:      SpO2: 96% 95% 98% 98%  PainSc:        Isolation Precautions Enteric precautions (UV disinfection)  Medications Medications  0.9 %  sodium chloride infusion ( Intravenous Stopped 07/13/19 2003)  ondansetron (ZOFRAN) injection 4 mg (4 mg Intravenous Given 07/12/19 2357)  rosuvastatin (CRESTOR) tablet 20 mg (20 mg Oral Given 07/13/19 1008)  diazepam (VALIUM) tablet 5 mg (has no administration in time range)  famotidine (PEPCID) tablet 20 mg (20 mg Oral Given 07/13/19 2144)  pantoprazole (PROTONIX) EC tablet 40 mg (40 mg Oral Given 07/13/19 1008)  magnesium oxide (MAG-OX) tablet 400 mg (400 mg Oral Given 07/13/19 1008)  potassium chloride (KLOR-CON) CR tablet 10 mEq (10  mEq Oral Given 07/13/19 2144)  loratadine (CLARITIN) tablet 10 mg (10 mg Oral Given 07/13/19 1008)  montelukast (SINGULAIR) tablet 10 mg (10 mg Oral Given 07/13/19 2144)  budesonide (PULMICORT) nebulizer solution 0.25 mg (0.25 mg Nebulization Given 07/13/19 1958)  acetaminophen (TYLENOL) tablet 650 mg (650 mg Oral Given 07/13/19 1424)  methimazole (TAPAZOLE) tablet 10 mg (10 mg Oral Given 07/13/19 2143)  apixaban (ELIQUIS) tablet 10 mg (10 mg Oral Given 07/13/19 2144)    Followed by  apixaban (ELIQUIS) tablet 5 mg (has no administration in time range)  metoprolol succinate (TOPROL-XL) 24 hr tablet 50 mg (has no administration in time range)  metoprolol tartrate (LOPRESSOR) injection 2.5 mg (2.5 mg Intravenous Given 07/12/19 1916)  enoxaparin (LOVENOX) injection 75 mg (75 mg Subcutaneous Given 07/12/19 1917)  metoprolol tartrate (LOPRESSOR) tablet 25 mg (25 mg Oral Given 07/12/19 2013)  furosemide (LASIX) injection 20 mg (20 mg Intravenous Given 07/12/19 2012)    Mobility walks Low fall risk   Focused Assessments ms   R Recommendations: See Admitting Provider Note  Report given to:   Additional Notes: restricted right arm

## 2019-07-13 NOTE — ED Notes (Signed)
Patient called out for BM and arm pain.   Patient placed on bedpan and patient given tylenol for right arm pain.

## 2019-07-13 NOTE — Progress Notes (Signed)
ANTICOAGULATION CONSULT NOTE - Initial Consult  Pharmacy Consult for enoxaparin Indication: DVT  Allergies  Allergen Reactions  . Methotrexate Derivatives Anaphylaxis    Cough, liver failure  . Lyrica [Pregabalin]     Slurred speech, blurred vision, confusion, incohert  . Other     PHENEGAN TABLET : MAKE PATIENT LUPPY PER PT., THUS CONFUSE  . Prednisone Swelling    Hives on face  . Sulfa Antibiotics Diarrhea    indigestion    Patient Measurements:   Heparin Dosing Weight:   Vital Signs: BP: 130/68 (01/04 0500) Pulse Rate: 101 (01/04 0500)  Labs: Recent Labs    07/10/19 1959 07/10/19 2010 07/10/19 2212 07/12/19 1713  HGB 10.7* 9.9*  --  10.7*  HCT 32.2* 29.0*  --  34.0*  PLT 136*  --   --  206  LABPROT  --   --   --  16.2*  INR  --   --   --  1.3*  CREATININE 1.08* 1.10*  --  1.17*  TROPONINIHS 9  --  10 10    Estimated Creatinine Clearance: 37.5 mL/min (A) (by C-G formula based on SCr of 1.17 mg/dL (H)).   Medical History: Past Medical History:  Diagnosis Date  . Allergic rhinitis   . Asthma   . DVT (deep venous thrombosis) (HCC) 2010   R leg, treated with coumadin x 6 months  . GERD (gastroesophageal reflux disease)   . Hearing disorder    R ear deafness  . Hyperlipemia   . Kidney calculi    recurrent, lithotripsy  . Meniere's disease   . Multinodular goiter   . Osteoarthritis   . Osteoporosis   . Post-menopausal   . Postural dizziness    postural syncope x 2  . Renal insufficiency   . Rosacea   . 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 Vitamin D deficiency     Medications:  (Not in a hospital admission)  Scheduled:  . aspirin  81 mg Oral Daily  . budesonide (PULMICORT) nebulizer solution  0.25 mg Nebulization BID  . enoxaparin (LOVENOX) injection  70 mg Subcutaneous Q12H  . famotidine  20 mg Oral QHS  . loratadine  10 mg Oral Daily  . magnesium oxide  400 mg Oral Daily  . montelukast  10 mg Oral QHS  .  pantoprazole  40 mg Oral Daily  . potassium chloride  10 mEq Oral BID  . propranolol  20 mg Oral TID  . rosuvastatin  20 mg Oral Daily    Assessment: Patient with Right UE DVT.  MD wants enoxaparin for treatment.  1st dose already given.  Goal of Therapy:  Anti-Xa level 0.6-1 units/ml 4hrs after LMWH dose given    Plan:  Enoxaparin 70mg  sq q12hr CBC q 3 days  , Darlina Guys Crowford 07/13/2019,5:51 AM

## 2019-07-13 NOTE — Progress Notes (Signed)
PROGRESS NOTE    Betty BattiestSusan Atkinson  WUJ:[id number] DOB: 10/29/1945 DOA: 07/12/2019 PCP: Betty Atkinson, Nelson, MD   Brief Narrative:  Patient is a 74 year old female with history of multinodular goiter, hypothyroidism, restless leg syndrome, Mnire's disease, stage III CKD who presents to the emergency department with complaints of persistent nausea, vomiting and diarrhea.  She was recently take antibiotics for sinusitis.  She had presented to the emergency department on 1/1 for palpitation, shortness of breath, nausea.  At the time she was found to be tachycardic.  CTA chest was negative for PE.  On presentation she was found to be tachycardic at this time.  Thyroid function panel slows hyperthyroidism.  Right upper extremity venous Doppler showed acute DVT of the right subclavian and right axillary vein, right basilic vein and right cephalic vein.  She has been started on Lovenox .  Levothyroxine discontinued.  Methimazole started.  Assessment & Plan:   Principal Problem:   Intractable vomiting with nausea Active Problems:   Personal history of goiter   GERD (gastroesophageal reflux disease)   Asthma   Hyperthyroidism   Fall   DVT (deep venous thrombosis) (HCC)   Chronic kidney disease, stage 3    Hyperthyroidism with multinodular goiter: [facility] Katrinka Atkinson.  Her endocrinologist recently discontinued methimazole after she was found to be hypothyroid and started on levothyroxine.  Labs done here showed hyperthyroidism.  Free T4 is more than 5.5.  Levothyroxine discontinued.  She will be started on methimazole 10 mg twice a day.  She needs to follow-up with her endocrinologist soon as possible.  She has been restarted on her metoprolol at home dose.  Nausea, vomiting, diarrhea: The symptoms are due to hyperthyroidism.  She does not have any nausea vomiting or diarrhea this morning.  Sinus tachycardia: Heart rate in the range of 110s.  Expect improvement after  starting on methimazole, metoprolol  Right upper extremity DVT: New finding.  She had a history of DVT in her right lower extremity about 10 years ago when she was on Coumadin.  She will be started on Eliquis.  Fall: Had a fall in the emergency department.  Negative head/cervical spine imagings.  PT/OT consult.  Normocytic anemia: Current H&H stable.   CKD stage IIIa: Currently kidney function is at baseline  GERD: Continue PPI  Asthma: Currently stable.          DVT prophylaxis: Eliquis Code Status: DNR Family Communication: None present at the bedside Disposition Plan: Likely home tomorrow   Consultants: None  Procedures: None  Antimicrobials:  Anti-infectives (From admission, onward)   None      Subjective:  Patient seen and examined at the bedside this morning.  She was in sinus tachycardia in the range of 110s.  Denies any chest pain.  She says she feels better today.  No nausea, vomiting or diarrhea. Objective: Vitals:   07/13/19 1230 07/13/19 1300 07/13/19 1330 07/13/19 1400  BP: 125/79 123/65 136/72 132/64  Pulse: 97 93 94 95  Resp: (!) 23 (!) 24 14 (!) 23  Temp:      TempSrc:      SpO2: 96% 97% 96% 94%    Intake/Output Summary (Last 24 hours) at 07/13/2019 1432 Last data filed at 07/13/2019 0953 Gross per 24 hour  Intake 0 ml  Output --  Net 0 ml   There were no vitals filed for this visit.  Examination:  General exam: Appears calm and comfortable ,Not in distress,average built HEENT:PERRL,Oral  mucosa moist, Ear/Nose normal on gross exam Respiratory system: Bilateral equal air entry, normal vesicular breath sounds, no wheezes or crackles  Cardiovascular system: sinus tachy, RRR. No JVD, murmurs, rubs, gallops or clicks. No pedal edema. Gastrointestinal system: Abdomen is nondistended, soft and nontender. No organomegaly or masses felt. Normal bowel sounds heard. Central nervous system: Alert and oriented. No focal neurological  deficits. Extremities: Edema of the right upper extremity, no clubbing ,no cyanosis, distal peripheral pulses palpable. Skin: No rashes, lesions or ulcers,no icterus ,no pallor   Data Reviewed: I have personally reviewed following labs and imaging studies  CBC: Recent Labs  Lab 07/10/19 1959 07/10/19 2010 07/12/19 1713 07/13/19 0631  WBC 5.0  --  8.9 7.1  NEUTROABS  --   --  6.5  --   HGB 10.7* 9.9* 10.7* 9.9*  HCT 32.2* 29.0* 34.0* 30.7*  MCV 95.5  --  99.7 98.4  PLT 136*  --  206 185   Basic Metabolic Panel: Recent Labs  Lab 07/10/19 1959 07/10/19 2010 07/12/19 1713 07/13/19 0631  NA 138 137 139 141  K 4.3 4.3 4.5 3.9  CL 105 105 105 107  CO2 21*  --  18* 21*  GLUCOSE 100* 96 110* 88  BUN 35* 33* 38* 38*  CREATININE 1.08* 1.10* 1.17* 1.10*  CALCIUM 9.2  --  9.5 8.9  MG  --   --  1.9  --    GFR: Estimated Creatinine Clearance: 39.9 mL/min (A) (by C-G formula based on SCr of 1.1 mg/dL (H)). Liver Function Tests: Recent Labs  Lab 07/10/19 1959 07/12/19 1713  AST 35 23  ALT 34 25  ALKPHOS 63 68  BILITOT 1.0 1.4*  PROT 5.6* 5.9*  ALBUMIN 2.9* 3.0*   Recent Labs  Lab 07/10/19 1959  LIPASE 32   No results for input(s): AMMONIA in the last 168 hours. Coagulation Profile: Recent Labs  Lab 07/12/19 1713  INR 1.3*   Cardiac Enzymes: No results for input(s): CKTOTAL, CKMB, CKMBINDEX, TROPONINI in the last 168 hours. BNP (last 3 results) No results for input(s): PROBNP in the last 8760 hours. HbA1C: No results for input(s): HGBA1C in the last 72 hours. CBG: No results for input(s): GLUCAP in the last 168 hours. Lipid Profile: No results for input(s): CHOL, HDL, LDLCALC, TRIG, CHOLHDL, LDLDIRECT in the last 72 hours. Thyroid Function Tests: Recent Labs    07/10/19 2212  TSH 0.011*  FREET4 >5.50*   Anemia Panel: No results for input(s): VITAMINB12, FOLATE, FERRITIN, TIBC, IRON, RETICCTPCT in the last 72 hours. Sepsis Labs: No results for  input(s): PROCALCITON, LATICACIDVEN in the last 168 hours.  Recent Results (from the past 240 hour(s))  SARS CORONAVIRUS 2 (TAT 6-24 HRS) Nasopharyngeal Nasopharyngeal Swab     Status: None   Collection Time: 07/12/19  8:04 PM   Specimen: Nasopharyngeal Swab  Result Value Ref Range Status   SARS Coronavirus 2 NEGATIVE NEGATIVE Final    Comment: (NOTE) SARS-CoV-2 target nucleic acids are NOT DETECTED. The SARS-CoV-2 RNA is generally detectable in upper and lower respiratory specimens during the acute phase of infection. Negative results do not preclude SARS-CoV-2 infection, do not rule out co-infections with other pathogens, and should not be used as the sole basis for treatment or other patient management decisions. Negative results must be combined with clinical observations, patient history, and epidemiological information. The expected result is Negative. Fact Sheet for Patients: HairSlick.no Fact Sheet for Healthcare Providers: quierodirigir.com This test is not yet approved or  cleared by the [place] and  has been authorized for detection and/or diagnosis of SARS-CoV-2 by FDA under an Emergency Use Authorization (EUA). This EUA will remain  in effect (meaning this test can be used) for the duration of the COVID-19 declaration under Section 56 4(b)(1) of the Act, 21 U.S.C. section 360bbb-3(b)(1), unless the authorization is terminated or revoked sooner. Performed at [facility], [address] [place], [place] [address]          Radiology Studies: DG Chest 2 View  Result Date: 07/12/2019 CLINICAL DATA:  Right arm pain following prior IV with shortness of breath, initial encounter EXAM: CHEST - 2 VIEW COMPARISON:  07/09/2018 FINDINGS: Cardiac shadow is stable. Aortic calcifications are again seen. The lungs are well aerated bilaterally. No focal infiltrate or sizable effusion is seen. No bony abnormality  is noted. IMPRESSION: No active cardiopulmonary disease. Electronically Signed   By: Alcide Clever M.D.   On: 07/12/2019 16:25   CT Head Wo Contrast  Result Date: 07/12/2019 CLINICAL DATA:  Fall hitting head. Recently started Lovenox EXAM: CT HEAD WITHOUT CONTRAST TECHNIQUE: Contiguous axial images were obtained from the base of the skull through the vertex without intravenous contrast. COMPARISON:  Head CT 02/02/2010 FINDINGS: Brain: No intracranial hemorrhage, mass effect, or midline shift. Brain volume is normal for age. No hydrocephalus. The basilar cisterns are patent. No evidence of territorial infarct or acute ischemia. No extra-axial or intracranial fluid collection. Vascular: Atherosclerosis of skullbase vasculature without hyperdense vessel or abnormal calcification. Skull: No fracture or focal lesion. Sinuses/Orbits: Paranasal sinuses and mastoid air cells are clear. The visualized orbits are unremarkable. Other: None. IMPRESSION: No acute intracranial abnormality. No skull fracture. Electronically Signed   By: Narda Rutherford M.D.   On: 07/12/2019 22:01   CT Cervical Spine Wo Contrast  Result Date: 07/12/2019 CLINICAL DATA:  Fall with posterior neck pain. EXAM: CT CERVICAL SPINE WITHOUT CONTRAST TECHNIQUE: Multidetector CT imaging of the cervical spine was performed without intravenous contrast. Multiplanar CT image reconstructions were also generated. COMPARISON:  None. FINDINGS: Alignment: Slight levo scoliotic curvature of the lower cervical spine. No traumatic subluxation. Skull base and vertebrae: No acute fracture. 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 Vertebral body heights are maintained. The dens and skull base are intact. Soft tissues and spinal canal: No prevertebral fluid or swelling. No visible canal hematoma. Disc levels: Congenitally small C2-C3 disc space. Disc space narrowing and endplate spurring at C5-C6 through C7-T1, mild. Mild  scattered facet hypertrophy. Upper chest: Heterogeneous enlarged thyroid gland, previously evaluated with ultrasound. No acute findings. Other: None. IMPRESSION: 1. Mild degenerative change in the cervical spine without acute fracture or subluxation. 2. 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 Electronically Signed   By: Narda Rutherford M.D.   On: 07/12/2019 22:05   UE Venous Duplex ([facility] and [facility] ONLY)  Result Date: 07/12/2019 UPPER VENOUS STUDY  Indications: Swelling, and Pain Risk Factors: None identified. Comparison Study: No prior studies. Performing Technologist: Chanda Busing RVT  Examination Guidelines: A complete evaluation includes B-mode imaging, spectral Doppler, color Doppler, and power Doppler as needed of all accessible portions of each vessel. Bilateral testing is considered an integral part of a complete examination. Limited examinations for reoccurring indications may be performed as noted.  Right Findings: +----------+------------+---------+-----------+----------+-------+ RIGHT     CompressiblePhasicitySpontaneousPropertiesSummary +----------+------------+---------+-----------+----------+-------+ IJV           Full  Yes       Yes                      +----------+------------+---------+-----------+----------+-------+ Subclavian    None       No        No                Acute  +----------+------------+---------+-----------+----------+-------+ Axillary      None       No        No                Acute  +----------+------------+---------+-----------+----------+-------+ Brachial      Full       No        No                       +----------+------------+---------+-----------+----------+-------+ Radial        Full                                          +----------+------------+---------+-----------+----------+-------+ Ulnar         Full                                           +----------+------------+---------+-----------+----------+-------+ Cephalic      None                                   Acute  +----------+------------+---------+-----------+----------+-------+ Basilic       None                                   Acute  +----------+------------+---------+-----------+----------+-------+  Left Findings: +----------+------------+---------+-----------+----------+-------+ LEFT      CompressiblePhasicitySpontaneousPropertiesSummary +----------+------------+---------+-----------+----------+-------+ Subclavian    Full       Yes       Yes                      +----------+------------+---------+-----------+----------+-------+  Summary:  Right: Findings consistent with acute deep vein thrombosis involving the right subclavian vein and right axillary vein. Findings consistent with acute superficial vein thrombosis involving the right basilic vein and right cephalic vein.  Left: No evidence of thrombosis in the subclavian.  *See table(s) above for measurements and observations.  Diagnosing physician: Waverly Ferrari MD Electronically signed by Waverly Ferrari MD on 07/12/2019 at 5:04:17 PM.    Final         Scheduled Meds: . aspirin  81 mg Oral Daily  . budesonide (PULMICORT) nebulizer solution  0.25 mg Nebulization BID  . enoxaparin (LOVENOX) injection  70 mg Subcutaneous Q12H  . famotidine  20 mg Oral QHS  . loratadine  10 mg Oral Daily  . magnesium oxide  400 mg Oral Daily  . metoprolol succinate  100 mg Oral Daily  . montelukast  10 mg Oral QHS  . pantoprazole  40 mg Oral Daily  . potassium chloride  10 mEq Oral BID  . rosuvastatin  20 mg Oral Daily   Continuous Infusions:   LOS: 1 day    Time spent: 35 mins.More than 50% of that time was  spent in counseling and/or coordination of care.      Shelly Coss, MD [facility] Pager [phone]  If 7PM-7AM, please contact night-coverage [email or web address] Password  [facility] 07/13/2019, 2:32 PM

## 2019-07-13 NOTE — Discharge Instructions (Signed)
Information on my medicine - ELIQUIS (apixaban)  Why was Eliquis prescribed for you? Eliquis was prescribed to treat blood clots that have been found in the veins of your right arm (deep vein thrombosis) and to reduce the risk of them occurring again.  What do You need to know about Eliquis ? The starting dose is 10 mg (two 5 mg tablets) taken TWICE daily for the FIRST SEVEN (7) DAYS, then on 07/20/2019, the dose is reduced to ONE 5 mg tablet taken TWICE daily.  Eliquis may be taken with or without food.   Try to take the dose about the same time in the morning and in the evening. If you have difficulty swallowing the tablet whole please discuss with your pharmacist how to take the medication safely.  Take Eliquis exactly as prescribed and DO NOT stop taking Eliquis without talking to the doctor who prescribed the medication.  Stopping may increase your risk of developing a new blood clot.  Refill your prescription before you run out.  After discharge, you should have regular check-up appointments with your healthcare provider that is prescribing your Eliquis.    What do you do if you miss a dose? If a dose of ELIQUIS is not taken at the scheduled time, take it as soon as possible on the same day and twice-daily administration should be resumed. The dose should not be doubled to make up for a missed dose.  Important Safety Information A possible side effect of Eliquis is bleeding. You should call your healthcare provider right away if you experience any of the following: ? Bleeding from an injury or your nose that does not stop. ? Unusual colored urine (red or dark brown) or unusual colored stools (red or black). ? Unusual bruising for unknown reasons. ? A serious fall or if you hit your head (even if there is no bleeding).  Some medicines may interact with Eliquis and might increase your risk of bleeding or clotting while on Eliquis. To help avoid this, consult your healthcare  provider or pharmacist prior to using any new prescription or non-prescription medications, including herbals, vitamins, non-steroidal anti-inflammatory drugs (NSAIDs) and supplements.  This website has more information on Eliquis (apixaban): http://www.eliquis.com/eliquis/home

## 2019-07-14 LAB — CBC
HCT: 32.2 % — ABNORMAL LOW (ref 36.0–46.0)
Hemoglobin: 10.5 g/dL — ABNORMAL LOW (ref 12.0–15.0)
MCH: 31.9 pg (ref 26.0–34.0)
MCHC: 32.6 g/dL (ref 30.0–36.0)
MCV: 97.9 fL (ref 80.0–100.0)
Platelets: 210 10*3/uL (ref 150–400)
RBC: 3.29 MIL/uL — ABNORMAL LOW (ref 3.87–5.11)
RDW: 11.4 % — ABNORMAL LOW (ref 11.5–15.5)
WBC: 5.5 10*3/uL (ref 4.0–10.5)
nRBC: 0 % (ref 0.0–0.2)

## 2019-07-14 MED ORDER — ONDANSETRON 4 MG PO TBDP
4.0000 mg | ORAL_TABLET | Freq: Once | ORAL | Status: AC
  Administered 2019-07-14: 4 mg via ORAL
  Filled 2019-07-14: qty 1

## 2019-07-14 MED ORDER — METHIMAZOLE 10 MG PO TABS: 10.0000 mg | ORAL_TABLET | Freq: Two times a day (BID) | ORAL | 0 refills | Status: DC

## 2019-07-14 MED ORDER — METOPROLOL SUCCINATE ER 50 MG PO TB24: 50.0000 mg | ORAL_TABLET | Freq: Every day | ORAL | 0 refills | Status: DC

## 2019-07-14 MED ORDER — DIPHENOXYLATE-ATROPINE 2.5-0.025 MG PO TABS: 1.0000 | ORAL_TABLET | Freq: Four times a day (QID) | ORAL | 0 refills | Status: AC | PRN

## 2019-07-14 MED ORDER — APIXABAN 5 MG PO TABS: 5.0000 mg | ORAL_TABLET | Freq: Two times a day (BID) | ORAL | 0 refills | Status: DC

## 2019-07-14 MED ORDER — APIXABAN 5 MG PO TABS: 10.0000 mg | ORAL_TABLET | Freq: Two times a day (BID) | ORAL | 0 refills | Status: DC

## 2019-07-14 NOTE — TOC Transition Note (Signed)
Transition of Care [facility]) - CM/SW Discharge Note   Patient Details  Name: Betty Atkinson MRN: [id number] Date of Birth: [birth year]  [facility] CM/SW Contact:  Lanier Clam, RN Phone Number: 07/14/2019, 11:52 AM   Clinical Narrative:       Final next level of care: Home w Home Health Services Barriers to Discharge: No Barriers Identified   Patient Goals and CMS Choice     Choice offered to / list presented to : Patient  Discharge Placement                       Discharge Plan and Services   Discharge Planning Services: CM Consult Post Acute Care Choice: Home Health                    HH Arranged: PT [facility] Agency: [facility] Date [facility] Agency Contacted: 07/14/19 Time HH Agency Contacted: [phone] Representative spoke with at [facility] Agency: Kandee Keen  Social Determinants of Health (SDOH) Interventions     Readmission Risk Interventions No flowsheet data found.

## 2019-07-14 NOTE — Discharge Summary (Addendum)
Physician Discharge Summary  Jalaiya Oyster XTK:[id number] DOB: Nov 24, 1945 DOA: 07/12/2019  PCP: Albertina Senegal, MD  Admit date: 07/12/2019 Discharge date: 07/14/2019  Admitted From: Home Disposition:  Home  Discharge Condition:Stable CODE STATUS:FULL Diet recommendation: Heart Healthy  Brief/Interim Summary:  Patient is a 74 year old female with history of multinodular goiter, hypothyroidism, restless leg syndrome, Mnire's disease, stage III CKD who presents to the emergency department with complaints of persistent nausea, vomiting and diarrhea.  She was recently take antibiotics for sinusitis.  She had presented to the emergency department on 1/1 for palpitation, shortness of breath, nausea.  At the time she was found to be tachycardic.  CTA chest was negative for PE.  On presentation she was found to be tachycardic this time also.  Thyroid function panel slows hyperthyroidism.  Right upper extremity venous Doppler showed acute DVT of the right subclavian and right axillary vein, right basilic vein and right cephalic vein.  She has been started on Lovenox Levothyroxine discontinued.  Methimazole started. This morning she remains hemodynamically stable.  Heart rate has been well controlled.  She was seen by PT and recommended home health.  She is hemodynamically stable for discharge to home today.  She needs to continue methimazole for now.  She needs to follow-up with her endocrinologist as soon as possible.  Following problems were addressed during her hospitalization:  Hyperthyroidism with multinodular goiter: Follows with endocrinology at [facility] with Dr. Katrinka Blazing.  Her endocrinologist recently discontinued methimazole after she was found to be hypothyroid and started on levothyroxine.  Labs done here showed hyperthyroidism.  Free T4 is more than 5.5.  Levothyroxine discontinued.  She has been  started on methimazole 10 mg twice a day.  She needs to follow-up with her endocrinologist soon as  possible.  She has been restarted on her metoprolol succinate 50 mg daily.  Nausea, vomiting, diarrhea: The symptoms are due to hyperthyroidism.  She does not have any nausea vomiting or diarrhea this morning.  Sinus tachycardia: Heart rate was in the range of 110s.  Significant improvement after starting on methimazole, metoprolol  Right upper extremity DVT: New finding.  She had a history of DVT in her right lower extremity about 10 years ago when she was on Coumadin.  She will be started on Eliquis.  Fall: Had a fall in the emergency department.  Negative head/cervical spine imagings.  PT recommended home health.  Normocytic anemia: Current H&H stable.   CKD stage IIIa: Currently kidney function is at baseline  GERD: Continue PPI  Asthma: Currently stable.  Discharge Diagnoses:  Principal Problem:   Intractable vomiting with nausea Active Problems:   Personal history of goiter   GERD (gastroesophageal reflux disease)   Asthma   Hyperthyroidism   Fall   DVT (deep venous thrombosis) (HCC)   Chronic kidney disease, stage 3    Discharge Instructions  Discharge Instructions    Diet - low sodium heart healthy   Complete by: As directed    Discharge instructions   Complete by: As directed    1)Please follow up with your Endocrinologist as soon as possible. 2)Follow up with your PCP in a week. 3)Take prescribed medications as instructed.   Increase activity slowly   Complete by: As directed      Allergies as of 07/14/2019      Reactions   Methotrexate Derivatives Anaphylaxis   Cough, liver failure   Lyrica [pregabalin]    Slurred speech, blurred vision, confusion, incohert   Other  PHENEGAN TABLET : MAKE PATIENT LUPPY PER PT., THUS CONFUSE   Prednisone Swelling   Hives on face   Sulfa Antibiotics Diarrhea   indigestion      Medication List    STOP taking these medications   aspirin 81 MG chewable tablet   levothyroxine 50 MCG tablet Commonly known  as: SYNTHROID     TAKE these medications   albuterol 108 (90 Base) MCG/ACT inhaler Commonly known as: VENTOLIN HFA Inhale 1-2 puffs into the lungs every 6 (six) hours as needed for wheezing or shortness of breath.   apixaban 5 MG Tabs tablet Commonly known as: ELIQUIS Take 2 tablets (10 mg total) by mouth 2 (two) times daily for 6 days.   apixaban 5 MG Tabs tablet Commonly known as: ELIQUIS Take 1 tablet (5 mg total) by mouth 2 (two) times daily. Start taking on: July 20, 2019   Asmanex (14 Metered Doses) 220 MCG/INH inhaler Generic drug: mometasone USE I TO 2 PUFF DAILY AT BED TIME   cetirizine 10 MG tablet Commonly known as: ZYRTEC Take 10 mg by mouth daily.   diazepam 5 MG tablet Commonly known as: VALIUM Take 5 mg by mouth every 6 (six) hours as needed for anxiety.   diphenoxylate-atropine 2.5-0.025 MG tablet Commonly known as: Lomotil Take 1 tablet by mouth 4 (four) times daily as needed for diarrhea or loose stools.   famotidine 20 MG tablet Commonly known as: PEPCID Take 20 mg by mouth 2 (two) times daily.   Finacea 15 % cream Generic drug: Azelaic Acid Apply topically daily. After skin is thoroughly washed and patted dry, gently but thoroughly massage a thin film of azelaic acid cream into the affected area twice daily, in the morning and evening.   Magnesium 500 MG Caps Take 1 capsule by mouth daily.   methimazole 10 MG tablet Commonly known as: TAPAZOLE Take 1 tablet (10 mg total) by mouth 2 (two) times daily.   metoprolol succinate 50 MG 24 hr tablet Commonly known as: TOPROL-XL Take 1 tablet (50 mg total) by mouth daily. What changed: when to take this   mometasone 50 MCG/ACT nasal spray Commonly known as: NASONEX Place 2 sprays into the nose at bedtime.   montelukast 10 MG tablet Commonly known as: SINGULAIR Take 10 mg by mouth at bedtime.   ondansetron 4 MG disintegrating tablet Commonly known as: Zofran ODT Take 1 tablet (4 mg total) by  mouth every 8 (eight) hours as needed for nausea or vomiting.   potassium chloride 10 MEQ tablet Commonly known as: KLOR-CON TAKE 1 TABLET BY MOUTH TWICE DAILY What changed: how to take this   PRILOSEC OTC PO Take 1 tablet by mouth daily.   Probiotic 1-250 BILLION-MG Caps Take 1 capsule by mouth daily.   promethazine 12.5 MG suppository Commonly known as: PHENERGAN Place 12.5 mg rectally every 6 (six) hours as needed for nausea.   rosuvastatin 40 MG tablet Commonly known as: CRESTOR Take 1/2 - 1 tab daily What changed:   how much to take  how to take this  when to take this  additional instructions   VITAMIN B-2 PO Take 200 mg by mouth 4 (four) times daily.   Vitamin D (Ergocalciferol) 1.25 MG (50000 UT) Caps capsule Commonly known as: DRISDOL Take 50,000 Units by mouth every 7 (seven) days. Take on Sunday   vitamin E 400 UNIT capsule Take 400 Units by mouth daily.      Follow-up Information    [place],  Meda Coffee, MD. Schedule an appointment as soon as possible for a visit in 1 week(s).   Specialty: Internal Medicine Contact information: [address] [place] [address] [phone]        Care, [facility] Follow up.   Specialty: Home Health Services Why: [place] [facility] Contact information: [address] [place] [address] [phone]          Allergies  Allergen Reactions  . Methotrexate Derivatives Anaphylaxis    Cough, liver failure  . Lyrica [Pregabalin]     Slurred speech, blurred vision, confusion, incohert  . Other     PHENEGAN TABLET : MAKE PATIENT LUPPY PER PT., THUS CONFUSE  . Prednisone Swelling    Hives on face  . Sulfa Antibiotics Diarrhea    indigestion    Consultations:  None   Procedures/Studies: DG Chest 2 View  Result Date: 07/12/2019 CLINICAL DATA:  Right arm pain following prior IV with shortness of breath, initial encounter EXAM: CHEST - 2 VIEW COMPARISON:  07/09/2018 FINDINGS:  Cardiac shadow is stable. Aortic calcifications are again seen. The lungs are well aerated bilaterally. No focal infiltrate or sizable effusion is seen. No bony abnormality is noted. IMPRESSION: No active cardiopulmonary disease. Electronically Signed   By: Inez Catalina M.D.   On: 07/12/2019 16:25   CT Head Wo Contrast  Result Date: 07/12/2019 CLINICAL DATA:  Fall hitting head. Recently started Lovenox EXAM: CT HEAD WITHOUT CONTRAST TECHNIQUE: Contiguous axial images were obtained from the base of the skull through the vertex without intravenous contrast. COMPARISON:  Head CT 02/02/2010 FINDINGS: Brain: No intracranial hemorrhage, mass effect, or midline shift. Brain volume is normal for age. No hydrocephalus. The basilar cisterns are patent. No evidence of territorial infarct or acute ischemia. No extra-axial or intracranial fluid collection. Vascular: Atherosclerosis of skullbase vasculature without hyperdense vessel or abnormal calcification. Skull: No fracture or focal lesion. Sinuses/Orbits: Paranasal sinuses and mastoid air cells are clear. The visualized orbits are unremarkable. Other: None. IMPRESSION: No acute intracranial abnormality. No skull fracture. Electronically Signed   By: Keith Rake M.D.   On: 07/12/2019 22:01   CT Angio Chest PE W and/or Wo Contrast  Result Date: 07/10/2019 CLINICAL DATA:  Shortness of breath EXAM: CT ANGIOGRAPHY CHEST WITH CONTRAST TECHNIQUE: Multidetector CT imaging of the chest was performed using the standard protocol during bolus administration of intravenous contrast. Multiplanar CT image reconstructions and MIPs were obtained to evaluate the vascular anatomy. CONTRAST:  22mL OMNIPAQUE IOHEXOL 350 MG/ML SOLN COMPARISON:  Thyroid ultrasound, 01/16/2019 FINDINGS: Cardiovascular: Satisfactory opacification of the pulmonary arteries to the segmental level. Examination for pulmonary embolism is significantly limited by breath motion artifact, particularly in the lung  bases. Within this limitation, no evidence of pulmonary embolism through proximal segmental arterial level. Normal heart size. Left coronary artery calcifications. No pericardial effusion. Aortic atherosclerosis. Mediastinum/Nodes: No enlarged mediastinal, hilar, or axillary lymph nodes. Bulky, heterogeneous thyroid goiter. Trachea, and esophagus demonstrate no significant findings. Lungs/Pleura: Mild dependent bibasilar scarring. No pleural effusion or pneumothorax. Upper Abdomen: No acute abnormality. Musculoskeletal: No chest wall abnormality. No acute or significant osseous findings. Review of the MIP images confirms the above findings. IMPRESSION: 1. Examination is significantly limited by breath motion artifact, particularly in the lung bases. Within this limitation, no evidence of pulmonary embolus through proximal segmental arterial level. 2. Coronary artery disease. 3. Bulky, heterogeneous thyroid goiter, previously evaluated by thyroid ultrasound. 4. Aortic Atherosclerosis (ICD10-I70.0). Electronically Signed   By: Eddie Candle  M.D.   On: 07/10/2019 21:23   CT Cervical Spine Wo Contrast  Result Date: 07/12/2019 CLINICAL DATA:  Fall with posterior neck pain. EXAM: CT CERVICAL SPINE WITHOUT CONTRAST TECHNIQUE: Multidetector CT imaging of the cervical spine was performed without intravenous contrast. Multiplanar CT image reconstructions were also generated. COMPARISON:  None. FINDINGS: Alignment: Slight levo scoliotic curvature of the lower cervical spine. No traumatic subluxation. Skull base and vertebrae: No acute fracture. 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 Vertebral body heights are maintained. The dens and skull base are intact. Soft tissues and spinal canal: No prevertebral fluid or swelling. No visible canal hematoma. Disc levels: Congenitally small C2-C3 disc space. Disc space narrowing and endplate spurring at C5-C6 through C7-T1, mild. Mild scattered  facet hypertrophy. Upper chest: Heterogeneous enlarged thyroid gland, previously evaluated with ultrasound. No acute findings. Other: None. IMPRESSION: 1. Mild degenerative change in the cervical spine without acute fracture or subluxation. 2. 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 Electronically Signed   By: Narda RutherfordMelanie  Sanford M.D.   On: 07/12/2019 22:05   CT ABDOMEN PELVIS W CONTRAST  Result Date: 07/10/2019 CLINICAL DATA:  Abdominal distension EXAM: CT ABDOMEN AND PELVIS WITH CONTRAST TECHNIQUE: Multidetector CT imaging of the abdomen and pelvis was performed using the standard protocol following bolus administration of intravenous contrast. CONTRAST:  80mL OMNIPAQUE IOHEXOL 350 MG/ML SOLN COMPARISON:  12/19/2015 FINDINGS: Lower chest: No acute abnormality. Hepatobiliary: Focal fatty deposition near the falciform ligament. Status post cholecystectomy. No biliary dilatation. Pancreas: Unremarkable. No pancreatic ductal dilatation or surrounding inflammatory changes. Spleen: Normal in size without significant abnormality. Adrenals/Urinary Tract: Adrenal glands are unremarkable. Kidneys are normal, without renal calculi, solid lesion, or hydronephrosis. Bladder is unremarkable. Stomach/Bowel: Stomach is within normal limits. Appendix is diminutive or surgically absent. No evidence of bowel wall thickening, distention, or inflammatory changes. Sigmoid diverticulosis. Vascular/Lymphatic: Aortic atherosclerosis. No enlarged abdominal or pelvic lymph nodes. Reproductive: Status post cholecystectomy. Other: No abdominal wall hernia or abnormality. No abdominopelvic ascites. Musculoskeletal: No acute or significant osseous findings. IMPRESSION: 1. No acute CT findings of the abdomen or pelvis. 2. Sigmoid diverticulosis without evidence of diverticulitis. 3. Aortic Atherosclerosis (ICD10-I70.0). Electronically Signed   By: Lauralyn PrimesAlex  Bibbey M.D.   On: 07/10/2019 21:27   DG Chest Port  1 View  Result Date: 07/10/2019 CLINICAL DATA:  Sinus infection. Complaining of shortness of breath. EXAM: PORTABLE CHEST 1 VIEW COMPARISON:  Chest radiograph 07/02/2019 FINDINGS: Stable cardiomediastinal contours. Aortic arch calcification. No new focal infiltrate. Eventration of the left hemidiaphragm similar to prior. No pneumothorax or large pleural effusion. No acute finding in the visualized skeleton. IMPRESSION: No evidence of active disease in the chest. Electronically Signed   By: Emmaline KluverNancy  Ballantyne M.D.   On: 07/10/2019 19:44   DG Chest Port 1 View  Result Date: 07/02/2019 CLINICAL DATA:  Sinus congestion and headache, nausea, vomiting, generalized weakness EXAM: PORTABLE CHEST 1 VIEW COMPARISON:  Portable exam 1455 hours compared to 06/24/2008 FINDINGS: Normal heart size, mediastinal contours, and pulmonary vascularity. Atherosclerotic calcification aorta. Minimal bronchitic changes with subsegmental atelectasis at RIGHT base. No pleural effusion or pneumothorax. Bones demineralized. IMPRESSION: Bronchitic changes with subsegmental atelectasis at RIGHT base. Aortic Atherosclerosis (ICD10-I70.0). Electronically Signed   By: Ulyses SouthwardMark  Boles M.D.   On: 07/02/2019 15:12   UE Venous Duplex ([facility] and [facility] ONLY)  Result Date: 07/12/2019 UPPER VENOUS STUDY  Indications: Swelling, and Pain Risk Factors: None identified. Comparison Study: No prior studies. Performing  Technologist: Chanda BusingGregory Collins RVT  Examination Guidelines: A complete evaluation includes B-mode imaging, spectral Doppler, color Doppler, and power Doppler as needed of all accessible portions of each vessel. Bilateral testing is considered an integral part of a complete examination. Limited examinations for reoccurring indications may be performed as noted.  Right Findings: +----------+------------+---------+-----------+----------+-------+ RIGHT     CompressiblePhasicitySpontaneousPropertiesSummary  +----------+------------+---------+-----------+----------+-------+ IJV           Full       Yes       Yes                      +----------+------------+---------+-----------+----------+-------+ Subclavian    None       No        No                Acute  +----------+------------+---------+-----------+----------+-------+ Axillary      None       No        No                Acute  +----------+------------+---------+-----------+----------+-------+ Brachial      Full       No        No                       +----------+------------+---------+-----------+----------+-------+ Radial        Full                                          +----------+------------+---------+-----------+----------+-------+ Ulnar         Full                                          +----------+------------+---------+-----------+----------+-------+ Cephalic      None                                   Acute  +----------+------------+---------+-----------+----------+-------+ Basilic       None                                   Acute  +----------+------------+---------+-----------+----------+-------+  Left Findings: +----------+------------+---------+-----------+----------+-------+ LEFT      CompressiblePhasicitySpontaneousPropertiesSummary +----------+------------+---------+-----------+----------+-------+ Subclavian    Full       Yes       Yes                      +----------+------------+---------+-----------+----------+-------+  Summary:  Right: Findings consistent with acute deep vein thrombosis involving the right subclavian vein and right axillary vein. Findings consistent with acute superficial vein thrombosis involving the right basilic vein and right cephalic vein.  Left: No evidence of thrombosis in the subclavian.  *See table(s) above for measurements and observations.  Diagnosing physician: Waverly Ferrarihristopher Dickson MD Electronically signed by Waverly Ferrarihristopher Dickson MD on  07/12/2019 at 5:04:17 PM.    Final       Subjective:  Patient seen and examined at  the bedside this morning.  Hemodynamically stable for discharge.  Discharge Exam: Vitals:   07/14/19 0612 07/14/19 1439  BP: (!) 125/55 129/75  Pulse: 84 88  Resp: 16 16  Temp: 98.5 F (36.9 C) 97.9 F (36.6 C)  SpO2: 95% 96%   Vitals:   07/13/19 2315 07/13/19 2344 07/14/19 0612 07/14/19 1439  BP:  (!) 146/75 (!) 125/55 129/75  Pulse:  90 84 88  Resp: (!) 24 (!) 22 16 16   Temp:  97.8 F (36.6 C) 98.5 F (36.9 C) 97.9 F (36.6 C)  TempSrc:  Oral Oral Oral  SpO2:  100% 95% 96%  Weight:  67.4 kg    Height:  5' (1.524 m)      General: Pt is alert, awake, not in acute distress Cardiovascular: RRR, S1/S2 +, no rubs, no gallops Respiratory: CTA bilaterally, no wheezing, no rhonchi Abdominal: Soft, NT, ND, bowel sounds + Extremities: no edema, no cyanosis    The results of significant diagnostics from this hospitalization (including imaging, microbiology, ancillary and laboratory) are listed below for reference.     Microbiology: Recent Results (from the past 240 hour(s))  SARS CORONAVIRUS 2 (TAT 6-24 HRS) Nasopharyngeal Nasopharyngeal Swab     Status: None   Collection Time: 07/12/19  8:04 PM   Specimen: Nasopharyngeal Swab  Result Value Ref Range Status   SARS Coronavirus 2 NEGATIVE NEGATIVE Final    Comment: (NOTE) SARS-CoV-2 target nucleic acids are NOT DETECTED. The SARS-CoV-2 RNA is generally detectable in upper and lower respiratory specimens during the acute phase of infection. Negative results do not preclude SARS-CoV-2 infection, do not rule out co-infections with other pathogens, and should not be used as the sole basis for treatment or other patient management decisions. Negative results must be combined with clinical observations, patient history, and epidemiological information. The expected result is Negative. Fact Sheet for  Patients: 09/09/19 Fact Sheet for Healthcare Providers: HairSlick.no This test is not yet approved or cleared by the [place] FDA and  has been authorized for detection and/or diagnosis of SARS-CoV-2 by FDA under an Emergency Use Authorization (EUA). This EUA will remain  in effect (meaning this test can be used) for the duration of the COVID-19 declaration under Section 56 4(b)(1) of the Act, 21 U.S.C. section 360bbb-3(b)(1), unless the authorization is terminated or revoked sooner. Performed at [facility], [address], [place], [place]      Labs: BNP (last 3 results) Recent Labs    07/12/19 1713  BNP 222.5*   Basic Metabolic Panel: Recent Labs  Lab 07/10/19 1959 07/10/19 2010 07/12/19 1713 07/13/19 0631  NA 138 137 139 141  K 4.3 4.3 4.5 3.9  CL 105 105 105 107  CO2 21*  --  18* 21*  GLUCOSE 100* 96 110* 88  BUN 35* 33* 38* 38*  CREATININE 1.08* 1.10* 1.17* 1.10*  CALCIUM 9.2  --  9.5 8.9  MG  --   --  1.9  --    Liver Function Tests: Recent Labs  Lab 07/10/19 1959 07/12/19 1713  AST 35 23  ALT 34 25  ALKPHOS 63 68  BILITOT 1.0 1.4*  PROT 5.6* 5.9*  ALBUMIN 2.9* 3.0*   Recent Labs  Lab 07/10/19 1959  LIPASE 32   No results for input(s): AMMONIA in the last 168 hours. CBC: Recent Labs  Lab 07/10/19 1959 07/10/19 2010 07/12/19 1713 07/13/19 0631 07/14/19 0453  WBC 5.0  --  8.9 7.1 5.5  NEUTROABS  --   --  6.5  --   --   HGB 10.7* 9.9* 10.7* 9.9* 10.5*  HCT 32.2* 29.0* 34.0* 30.7* 32.2*  MCV 95.5  --  99.7 98.4 97.9  PLT 136*  --  206 185 210   Cardiac Enzymes: No results for input(s): CKTOTAL, CKMB, CKMBINDEX, TROPONINI in the last 168 hours. BNP: Invalid input(s): POCBNP CBG: No results for input(s): GLUCAP in the last 168 hours. D-Dimer No results for input(s): DDIMER in the last 72 hours. Hgb A1c No results for input(s): HGBA1C in the last 72  hours. Lipid Profile No results for input(s): CHOL, HDL, LDLCALC, TRIG, CHOLHDL, LDLDIRECT in the last 72 hours. Thyroid function studies No results for input(s): TSH, T4TOTAL, T3FREE, THYROIDAB in the last 72 hours.  Invalid input(s): FREET3 Anemia work up No results for input(s): VITAMINB12, FOLATE, FERRITIN, TIBC, IRON, RETICCTPCT in the last 72 hours. Urinalysis    Component Value Date/Time   COLORURINE YELLOW 07/02/2019 1446   APPEARANCEUR CLEAR 07/02/2019 1446   LABSPEC >1.030 (H) 07/02/2019 1446   PHURINE 5.5 07/02/2019 1446   GLUCOSEU NEGATIVE 07/02/2019 1446   HGBUR TRACE (A) 07/02/2019 1446   BILIRUBINUR SMALL (A) 07/02/2019 1446   KETONESUR 15 (A) 07/02/2019 1446   PROTEINUR NEGATIVE 07/02/2019 1446   UROBILINOGEN 0.2 12/19/2014 1615   NITRITE NEGATIVE 07/02/2019 1446   LEUKOCYTESUR NEGATIVE 07/02/2019 1446   Sepsis Labs Invalid input(s): PROCALCITONIN,  WBC,  LACTICIDVEN Microbiology Recent Results (from the past 240 hour(s))  SARS CORONAVIRUS 2 (TAT 6-24 HRS) Nasopharyngeal Nasopharyngeal Swab     Status: None   Collection Time: 07/12/19  8:04 PM   Specimen: Nasopharyngeal Swab  Result Value Ref Range Status   SARS Coronavirus 2 NEGATIVE NEGATIVE Final    Comment: (NOTE) SARS-CoV-2 target nucleic acids are NOT DETECTED. The SARS-CoV-2 RNA is generally detectable in upper and lower respiratory specimens during the acute phase of infection. Negative results do not preclude SARS-CoV-2 infection, do not rule out co-infections with other pathogens, and should not be used as the sole basis for treatment or other patient management decisions. Negative results must be combined with clinical observations, patient history, and epidemiological information. The expected result is Negative. Fact Sheet for Patients: HairSlick.no Fact Sheet for Healthcare Providers: quierodirigir.com This test is not yet approved or  cleared by the [place] FDA and  has been authorized for detection and/or diagnosis of SARS-CoV-2 by FDA under an Emergency Use Authorization (EUA). This EUA will remain  in effect (meaning this test can be used) for the duration of the COVID-19 declaration under Section 56 4(b)(1) of the Act, 21 U.S.C. section 360bbb-3(b)(1), unless the authorization is terminated or revoked sooner. Performed at [facility], [address] [place], [place] [address]     Please note: You were cared for by a hospitalist during your hospital stay. Once you are discharged, your primary care physician will handle any further medical issues. Please note that NO REFILLS for any discharge medications will be authorized once you are discharged, as it is imperative that you return to your primary care physician (or establish a relationship with a primary care physician if you do not have one) for your post hospital discharge needs so that they can reassess your need for medications and monitor your lab values.    Time coordinating discharge: 40 minutes  SIGNED:   Burnadette Pop, MD  [facility] 07/14/2019, 3:49 PM Pager [phone]  If 7PM-7AM, please contact night-coverage [email or web address] Password [email or web address]

## 2019-07-14 NOTE — TOC Progression Note (Signed)
Transition of Care [facility]) - Progression Note    Patient Details  Name: Betty Atkinson MRN: [id number] Date of Birth: 1946/01/24  Transition of [facility] CM/SW Contact  Anzleigh Slaven, Olegario Messier, RN Phone Number: 07/14/2019, 11:02 AM  Clinical Narrative:   Awaiting HHC agency to accept for HHPT.    Expected Discharge Plan: Home w Home Health Services Barriers to Discharge: Other (comment)(await HHC agency acceptance)  Expected Discharge Plan and Services Expected Discharge Plan: Home w Home Health Services   Discharge Planning Services: CM Consult Post Acute Care Choice: Home Health   Expected Discharge Date: 07/14/19                                     Social Determinants of Health (SDOH) Interventions    Readmission Risk Interventions No flowsheet data found.

## 2019-07-14 NOTE — Plan of Care (Signed)
  Problem: Education: Goal: Knowledge of General Education information will improve Description: Including pain rating scale, medication(s)/side effects and non-pharmacologic comfort measures Outcome: Progressing   Problem: Clinical Measurements: Goal: Respiratory complications will improve Outcome: Progressing Goal: Cardiovascular complication will be avoided Outcome: Progressing   Problem: Activity: Goal: Risk for activity intolerance will decrease Outcome: Progressing   Problem: Elimination: Goal: Will not experience complications related to urinary retention Outcome: Progressing   Problem: Safety: Goal: Ability to remain free from injury will improve Outcome: Progressing   

## 2019-07-14 NOTE — Evaluation (Signed)
Physical Therapy Evaluation Patient Details Name: Betty Atkinson MRN: [id number] DOB: Apr 23, 1946 Today's Date: 07/14/2019   History of Present Illness  Pt is 74 y.o. 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 Pt found to have R UE DVT treated with Lovenox. Pt did have fall in ED with negative head/Cspine imaging.  Clinical Impression  Pt admitted with above diagnosis. Pt was able to transfers and ambulate 200' with min guard and RW.  She had c/o feeling weak for about 1 month.  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  Pt would benefit from acute PT and HHPT to advance mobility and strength to baseline.  Pt currently with functional limitations due to the deficits listed below (see PT Problem List). Pt will benefit from skilled PT to increase their independence and safety with mobility to allow discharge to the venue listed below.       Follow Up Recommendations Home health PT    Equipment Recommendations  None recommended by PT(pt has RW)    Recommendations for Other Services       Precautions / Restrictions Precautions Precautions: Fall Restrictions Weight Bearing Restrictions: No      Mobility  Bed Mobility Overal bed mobility: Needs Assistance Bed Mobility: Supine to Sit     Supine to sit: Modified independent (Device/Increase time)     General bed mobility comments: increased time  Transfers Overall transfer level: Needs assistance Equipment used: Rolling walker (2 wheeled) Transfers: Sit to/from Stand Sit to Stand: Min guard         General transfer comment: sit to stand x 2; min guard for safety; pt did required multiple attempts and momentum to  stand  Ambulation/Gait Ambulation/Gait assistance: Min guard Gait Distance (Feet): 200 Feet Assistive device: Rolling walker (2 wheeled) Gait Pattern/deviations: Step-through pattern Gait velocity: decreased   General Gait Details: cues for RW and to not scissor with turns; educated on setting RW height for home; after ~150' pt felt nauseated and mild lightheadiness, returned to room, improved after several minutes; VSS  Stairs            Wheelchair Mobility    Modified Rankin (Stroke Patients Only)       Balance Overall balance assessment: Needs assistance Sitting-balance support: No upper extremity supported;Feet supported Sitting balance-Leahy Scale: Normal     Standing balance support: Single extremity supported;During functional activity Standing balance-Leahy Scale: Fair                 High Level Balance Comments: Pt able to stand with 1 hand on RW and wipe bottom for ADLs without LOB             Pertinent Vitals/Pain Pain Assessment: 0-10 Pain Score: 10-Worst pain ever Pain Location: R arm Pain Descriptors / Indicators: Discomfort Pain Intervention(s): Limited activity within patient's tolerance;Other (comment)(pt with no signs of pain)    Home Living Family/patient expects to be discharged to:: Private residence Living Arrangements: Spouse/significant other;Parent(takes care of elderly mother with dementia) Available Help at Discharge: Available 24 hours/day;Family Type of Home: House Home Access: Stairs to enter Entrance Stairs-Rails: Right Entrance Stairs-Number of Steps: 2 small Home Layout: One level Home Equipment: Walker - 2 wheels;Bedside commode      Prior Function  Comments: Pt independent with all IADL, driving, and ADLs, and community ambulation.     Hand Dominance        Extremity/Trunk Assessment   Upper Extremity Assessment Upper Extremity Assessment: Overall WFL for tasks assessed    Lower Extremity  Assessment Lower Extremity Assessment: Overall WFL for tasks assessed    Cervical / Trunk Assessment Cervical / Trunk Assessment: Normal  Communication   Communication: No difficulties  Cognition Arousal/Alertness: Awake/alert Behavior During Therapy: WFL for tasks assessed/performed Overall Cognitive Status: Within Functional Limits for tasks assessed                                        General Comments General comments (skin integrity, edema, etc.): Pt with some c/o N/V and lightheadiness with ambulation: HR 100 bpm; O2 sats 98%; BP 133/88 (BP was stable with transfers)    Exercises     Assessment/Plan    PT Assessment Patient needs continued PT services  PT Problem List Decreased strength;Decreased activity tolerance;Decreased balance;Decreased knowledge of use of DME;Decreased coordination       PT Treatment Interventions DME instruction;Therapeutic activities;Gait training;Therapeutic exercise;Patient/family education;Stair training;Balance training;Functional mobility training    PT Goals (Current goals can be found in the Care Plan section)  Acute Rehab PT Goals Patient Stated Goal: get stronger; return home PT Goal Formulation: With patient Time For Goal Achievement: 07/28/19 Potential to Achieve Goals: Good    Frequency Min 3X/week   Barriers to discharge        Co-evaluation               AM-PAC PT "6 Clicks" Mobility  Outcome Measure Help needed turning from your back to your side while in a flat bed without using bedrails?: None Help needed moving from lying on your back to sitting on the side of a flat bed without using bedrails?: None Help needed moving to and from a bed to a chair (including a wheelchair)?: None Help needed standing up from a chair using your arms (e.g., wheelchair or bedside chair)?: None Help needed to walk in hospital room?: None Help needed climbing 3-5 steps with a railing? : A Little 6 Click Score:  23    End of Session Equipment Utilized During Treatment: Gait belt Activity Tolerance: Patient tolerated treatment well Patient left: in chair;with call bell/phone within reach Nurse Communication: Mobility status PT Visit Diagnosis: Unsteadiness on feet (R26.81);Other abnormalities of gait and mobility (R26.89);Muscle weakness (generalized) (M62.81);History of falling (Z91.81)    Time: 9211-9417 PT Time Calculation (min) (ACUTE ONLY): 34 min   Charges:   PT Evaluation $PT Eval Low Complexity: 1 Low PT Treatments $Gait Training: 8-22 mins        Royetta Asal, PT Acute Rehab Services Pager [phone] [facility] [phone] [facility] [phone]   Rayetta Humphrey 07/14/2019, 11:21 AM

## 2019-07-15 LAB — T3, FREE: T3, Free: 21.5 pg/mL — ABNORMAL HIGH (ref 2.0–4.4)

## 2021-05-11 ENCOUNTER — Emergency Department (HOSPITAL_BASED_OUTPATIENT_CLINIC_OR_DEPARTMENT_OTHER): Payer: Medicare Other

## 2021-05-11 ENCOUNTER — Other Ambulatory Visit: Payer: Self-pay

## 2021-05-11 ENCOUNTER — Encounter (HOSPITAL_BASED_OUTPATIENT_CLINIC_OR_DEPARTMENT_OTHER): Payer: Self-pay | Admitting: *Deleted

## 2021-05-11 ENCOUNTER — Emergency Department (HOSPITAL_BASED_OUTPATIENT_CLINIC_OR_DEPARTMENT_OTHER)
Admission: EM | Admit: 2021-05-11 | Discharge: 2021-05-11 | Disposition: A | Discharge status: Home / Self Care | Payer: Medicare Other | Attending: Emergency Medicine | Admitting: Emergency Medicine

## 2021-05-11 DIAGNOSIS — N183 Chronic kidney disease, stage 3 unspecified: Secondary | ICD-10-CM | POA: Diagnosis not present

## 2021-05-11 DIAGNOSIS — Z7901 Long term (current) use of anticoagulants: Secondary | ICD-10-CM | POA: Diagnosis not present

## 2021-05-11 DIAGNOSIS — M7989 Other specified soft tissue disorders: Secondary | ICD-10-CM | POA: Diagnosis not present

## 2021-05-11 DIAGNOSIS — Z20822 Contact with and (suspected) exposure to covid-19: Secondary | ICD-10-CM | POA: Insufficient documentation

## 2021-05-11 DIAGNOSIS — Z86718 Personal history of other venous thrombosis and embolism: Secondary | ICD-10-CM | POA: Insufficient documentation

## 2021-05-11 DIAGNOSIS — R0602 Shortness of breath: Secondary | ICD-10-CM | POA: Diagnosis present

## 2021-05-11 DIAGNOSIS — E039 Hypothyroidism, unspecified: Secondary | ICD-10-CM | POA: Insufficient documentation

## 2021-05-11 DIAGNOSIS — J45909 Unspecified asthma, uncomplicated: Secondary | ICD-10-CM | POA: Diagnosis not present

## 2021-05-11 DIAGNOSIS — R052 Subacute cough: Secondary | ICD-10-CM

## 2021-05-11 DIAGNOSIS — Z79899 Other long term (current) drug therapy: Secondary | ICD-10-CM | POA: Insufficient documentation

## 2021-05-11 LAB — CBC WITH DIFFERENTIAL/PLATELET
Abs Immature Granulocytes: 0 10*3/uL (ref 0.00–0.07)
Basophils Absolute: 0 10*3/uL (ref 0.0–0.1)
Basophils Relative: 0 %
Eosinophils Absolute: 0.1 10*3/uL (ref 0.0–0.5)
Eosinophils Relative: 1 %
HCT: 38.8 % (ref 36.0–46.0)
Hemoglobin: 13.1 g/dL (ref 12.0–15.0)
Immature Granulocytes: 0 %
Lymphocytes Relative: 45 %
Lymphs Abs: 2.1 10*3/uL (ref 0.7–4.0)
MCH: 31.5 pg (ref 26.0–34.0)
MCHC: 33.8 g/dL (ref 30.0–36.0)
MCV: 93.3 fL (ref 80.0–100.0)
Monocytes Absolute: 0.6 10*3/uL (ref 0.1–1.0)
Monocytes Relative: 13 %
Neutro Abs: 1.9 10*3/uL (ref 1.7–7.7)
Neutrophils Relative %: 41 %
Platelets: 185 10*3/uL (ref 150–400)
RBC: 4.16 MIL/uL (ref 3.87–5.11)
RDW: 11.6 % (ref 11.5–15.5)
WBC: 4.7 10*3/uL (ref 4.0–10.5)
nRBC: 0 % (ref 0.0–0.2)

## 2021-05-11 LAB — BRAIN NATRIURETIC PEPTIDE: B Natriuretic Peptide: 131.4 pg/mL — ABNORMAL HIGH (ref 0.0–100.0)

## 2021-05-11 LAB — COMPREHENSIVE METABOLIC PANEL
ALT: 24 U/L (ref 0–44)
AST: 30 U/L (ref 15–41)
Albumin: 3.6 g/dL (ref 3.5–5.0)
Alkaline Phosphatase: 60 U/L (ref 38–126)
Anion gap: 9 (ref 5–15)
BUN: 28 mg/dL — ABNORMAL HIGH (ref 8–23)
CO2: 24 mmol/L (ref 22–32)
Calcium: 9.9 mg/dL (ref 8.9–10.3)
Chloride: 107 mmol/L (ref 98–111)
Creatinine, Ser: 1.06 mg/dL — ABNORMAL HIGH (ref 0.44–1.00)
GFR, Estimated: 54 mL/min — ABNORMAL LOW (ref >60)
Glucose, Bld: 131 mg/dL — ABNORMAL HIGH (ref 70–99)
Potassium: 3.7 mmol/L (ref 3.5–5.1)
Sodium: 140 mmol/L (ref 135–145)
Total Bilirubin: 1.1 mg/dL (ref 0.3–1.2)
Total Protein: 6.5 g/dL (ref 6.5–8.1)

## 2021-05-11 LAB — RESP PANEL BY RT-PCR (FLU A&B, COVID) ARPGX2
Influenza A by PCR: NEGATIVE
Influenza B by PCR: NEGATIVE
SARS Coronavirus 2 by RT PCR: NEGATIVE

## 2021-05-11 LAB — TROPONIN I (HIGH SENSITIVITY): Troponin I (High Sensitivity): 5 ng/L (ref <18)

## 2021-05-11 MED ORDER — IPRATROPIUM-ALBUTEROL 0.5-2.5 (3) MG/3ML IN SOLN
3.0000 mL | Freq: Once | RESPIRATORY_TRACT | Status: AC
  Administered 2021-05-11: 3 mL via RESPIRATORY_TRACT
  Filled 2021-05-11: qty 3

## 2021-05-11 MED ORDER — IOHEXOL 350 MG/ML SOLN
75.0000 mL | Freq: Once | INTRAVENOUS | Status: AC | PRN
  Administered 2021-05-11: 75 mL via INTRAVENOUS

## 2021-05-11 MED ORDER — ALBUTEROL SULFATE HFA 108 (90 BASE) MCG/ACT IN AERS
2.0000 | INHALATION_SPRAY | Freq: Once | RESPIRATORY_TRACT | Status: AC
  Administered 2021-05-11: 2 via RESPIRATORY_TRACT
  Filled 2021-05-11: qty 6.7

## 2021-05-11 NOTE — ED Triage Notes (Signed)
C/o SOb, palpitations  and cough x 2 weeks , dizziness x 1 day , sent here by PMD for eval

## 2021-05-11 NOTE — Discharge Instructions (Signed)
1.  Your CT scan does not show any signs of pneumonia or a blood clot to your lungs.  Your EKG and heart labs do not show signs of a heart attack.  Nonetheless, it is very important that you follow-up closely with your doctor.  Call your cardiologist for a recheck.  You may need additional testing.  Also call your endocrinologist to review your thyroid tests that were done today in the emergency department.  Those results have not returned yet but will be available by tomorrow morning. 2.  Use your albuterol inhaler every 4-6 hours for shortness of breath.  You do have a history of asthma.  See if this improves your symptoms. 3.  Return to the emergency department if you develop new or concerning symptoms such as chest pain, lightheadedness, nausea and sweating or other concerning symptoms.

## 2021-05-11 NOTE — ED Provider Notes (Signed)
[facility] EMERGENCY DEPARTMENT Provider Note   CSN: [id number] Arrival date & time: 05/11/21  1356     History Chief Complaint  Patient presents with   Shortness of Breath    Betty Atkinson is a 75 y.o. female.  HPI Patient reports he started feeling short of breath about 2 weeks ago.  Symptoms were gradual in onset.  She reports that they were worse with exertion.  She reports about a week into the illness she started developing a dry cough.  No fevers or myalgias.  No nasal congestion.  Patient reports he does have a history of asthma but only used inhalers as needed.  She reports her inhaler expired a while ago so she has not been using it.  Patient reports both legs swell at baseline.  She reports right leg typically swells more than her left leg due to prior history of DVT.  She reports she is also had DVT in her right upper extremity.  She had been anticoagulated but is no longer anticoagulated per her medical management.  She denies ever being told that she needed chronic anticoagulation.  She has been immunized for COVID with posterior but has not yet had influenza shot.    Past Medical History:  Diagnosis Date   Allergic rhinitis    Asthma    DVT (deep venous thrombosis) (HCC) 2010   R leg, treated with coumadin x 6 months   GERD (gastroesophageal reflux disease)    Hearing disorder    R ear deafness   Hyperlipemia    Kidney calculi    recurrent, lithotripsy   Meniere's disease    Multinodular goiter    Osteoarthritis    Osteoporosis    Post-menopausal    Postural dizziness    postural syncope x 2   Renal insufficiency    Rosacea    Venous insufficiency of leg    post thrombotic syndrome   Vitamin D deficiency     Patient Active Problem List   Diagnosis Date Noted   Hyperthyroidism 07/13/2019   Fall 07/13/2019   DVT (deep venous thrombosis) (HCC) 07/13/2019   Chronic kidney disease, stage 3 (HCC) 07/13/2019   Intractable vomiting with nausea  07/12/2019   Need for prophylactic vaccination and inoculation against influenza 05/16/2013   Osteoarthritis 05/16/2013   Cellulitis of right arm 03/06/2013   Syncope 02/16/2013   Preoperative cardiovascular examination 02/16/2013   Shortness of breath 02/16/2013   Sinus tachycardia 02/16/2013   Asthma 02/08/2013   Potassium (K) deficiency 02/08/2013   Meniere's disease 02/08/2013   Weight gain 02/08/2013   Unspecified hypothyroidism 02/08/2013   History of DVT (deep vein thrombosis) 12/20/2011   H/O renal calculi 12/20/2011   Personal history of goiter 12/20/2011   DJD (degenerative joint disease) 12/20/2011   Other and unspecified hyperlipidemia 12/20/2011   GERD (gastroesophageal reflux disease) 12/20/2011   Menopause 12/20/2011   Allergic rhinitis 12/20/2011    Past Surgical History:  Procedure Laterality Date   CARDIAC CATHETERIZATION  2005   no CAD by cath at [facility]   LITHOTRIPSY     TONSILLECTOMY     TOTAL ABDOMINAL HYSTERECTOMY W/ BILATERAL SALPINGOOPHORECTOMY  1990     OB History     Gravida  2   Para  2   Term      Preterm      AB      Living  2      SAB      IAB  Ectopic      Multiple      Live Births              Family History  Problem Relation Age of Onset   Thyroid disease Father    Heart disease Father    Heart disease Mother    Diabetes Mother    Cancer Brother        fibrohisteofibroma   Heart disease Brother    Thyroid disease Maternal Grandmother     Social History   Tobacco Use   Smoking status: Never   Smokeless tobacco: Never  Substance Use Topics   Alcohol use: No   Drug use: No    Home Medications Prior to Admission medications   Medication Sig Start Date End Date Taking? Authorizing Provider  albuterol (PROVENTIL HFA;VENTOLIN HFA) 108 (90 BASE) MCG/ACT inhaler Inhale 1-2 puffs into the lungs every 6 (six) hours as needed for wheezing or shortness of breath.     [provider]   apixaban (ELIQUIS) 5 MG TABS tablet Take 2 tablets (10 mg total) by mouth 2 (two) times daily for 6 days. 07/14/19 07/20/19  Burnadette Pop, MD  apixaban (ELIQUIS) 5 MG TABS tablet Take 1 tablet (5 mg total) by mouth 2 (two) times daily. 07/20/19   Burnadette Pop, MD  Azelaic Acid (FINACEA) 15 % cream Apply topically daily. After skin is thoroughly washed and patted dry, gently but thoroughly massage a thin film of azelaic acid cream into the affected area twice daily, in the morning and evening.    [provider]  Bacillus Coagulans-Inulin (PROBIOTIC) 1-250 BILLION-MG CAPS Take 1 capsule by mouth daily. 07/02/19   Fawze, Mina A, PA-C  cetirizine (ZYRTEC) 10 MG tablet Take 10 mg by mouth daily.    [provider]  diazepam (VALIUM) 5 MG tablet Take 5 mg by mouth every 6 (six) hours as needed for anxiety.    [provider]  famotidine (PEPCID) 20 MG tablet Take 20 mg by mouth 2 (two) times daily. 06/10/19   [provider]  Magnesium 500 MG CAPS Take 1 capsule by mouth daily.      [provider]  methimazole (TAPAZOLE) 10 MG tablet Take 1 tablet (10 mg total) by mouth 2 (two) times daily. 07/14/19 08/13/19  Burnadette Pop, MD  metoprolol succinate (TOPROL-XL) 50 MG 24 hr tablet Take 1 tablet (50 mg total) by mouth daily. 07/14/19   Burnadette Pop, MD  mometasone (ASMANEX 14 METERED DOSES) 220 MCG/INH inhaler USE I TO 2 PUFF DAILY AT BED TIME    [provider]  mometasone (NASONEX) 50 MCG/ACT nasal spray Place 2 sprays into the nose at bedtime.      [provider]  montelukast (SINGULAIR) 10 MG tablet Take 10 mg by mouth at bedtime.    [provider]  Omeprazole Magnesium (PRILOSEC OTC PO) Take 1 tablet by mouth daily.     [provider]  ondansetron (ZOFRAN ODT) 4 MG disintegrating tablet Take 1 tablet (4 mg total) by mouth every 8 (eight) hours as needed for nausea or vomiting. 07/02/19   Fawze, Mina A, PA-C  potassium  chloride (K-DUR) 10 MEQ tablet TAKE 1 TABLET BY MOUTH TWICE DAILY Patient taking differently: 10 mEq 2 (two) times daily.  04/05/13   Gillian Scarce, MD  promethazine (PHENERGAN) 12.5 MG suppository Place 12.5 mg rectally every 6 (six) hours as needed for nausea.    [provider]  Riboflavin (VITAMIN B-2  PO) Take 200 mg by mouth 4 (four) times daily.     [provider]  rosuvastatin (CRESTOR) 40 MG tablet Take 1/2 - 1 tab daily Patient taking differently: Take 20 mg by mouth daily.  03/12/13 07/12/19  Gillian Scarce, MD  Vitamin D, Ergocalciferol, (DRISDOL) 50000 UNITS CAPS Take 50,000 Units by mouth every 7 (seven) days. Take on Sunday     [provider]  vitamin E 400 UNIT capsule Take 400 Units by mouth daily.      [provider]    Allergies    Methotrexate derivatives, Lyrica [pregabalin], Other, Prednisone, and Sulfa antibiotics  Review of Systems   Review of Systems 10 systems reviewed and negative except as per HPI Physical Exam Updated Vital Signs BP 125/62 (BP Location: Left Arm)   Pulse (!) 115   Temp (!) 97.4 F (36.3 C) (Oral)   Resp 16   Ht 5' (1.524 m)   Wt 65.8 kg   SpO2 100%   BMI 28.32 kg/m   Physical Exam Constitutional:      Appearance: Normal appearance.  HENT:     Mouth/Throat:     Pharynx: Oropharynx is clear.  Eyes:     Extraocular Movements: Extraocular movements intact.  Cardiovascular:     Rate and Rhythm: Normal rate and regular rhythm.  Pulmonary:     Effort: Pulmonary effort is normal.     Breath sounds: Normal breath sounds.  Abdominal:     General: There is no distension.     Palpations: Abdomen is soft.     Tenderness: There is no abdominal tenderness. There is no guarding.  Musculoskeletal:        General: Swelling present. No tenderness. Normal range of motion.     Comments: Right lower extremity 2+ swelling.  No significant pitting.  Calf nontender.  Left lower extremity 1+ swelling no  significant pitting.  Skin:    General: Skin is warm and dry.  Neurological:     General: No focal deficit present.     Mental Status: She is alert and oriented to person, place, and time.     Motor: No weakness.     Coordination: Coordination normal.  Psychiatric:        Mood and Affect: Mood normal.    ED Results / Procedures / Treatments   Labs (all labs ordered are listed, but only abnormal results are displayed) Labs Reviewed  COMPREHENSIVE METABOLIC PANEL - Abnormal; Notable for the following components:      Result Value   Glucose, Bld 131 (*)    BUN 28 (*)    Creatinine, Ser 1.06 (*)    GFR, Estimated 54 (*)    All other components within normal limits  BRAIN NATRIURETIC PEPTIDE - Abnormal; Notable for the following components:   B Natriuretic Peptide 131.4 (*)    All other components within normal limits  RESP PANEL BY RT-PCR (FLU A&B, COVID) ARPGX2  CBC WITH DIFFERENTIAL/PLATELET  TSH  T3, FREE  TROPONIN I (HIGH SENSITIVITY)    EKG EKG Interpretation  Date/Time:  Thursday May 11 2021 14:11:49 EDT Ventricular Rate:  91 PR Interval:  156 QRS Duration: 84 QT Interval:  356 QTC Calculation: 437 R Axis:   4 Text Interpretation: Normal sinus rhythm Normal ECG unchanged from previous except no PVC on current Confirmed by Arby Barrette [phone]) on 05/11/2021 9:32:47 PM  Radiology DG Chest 2 View  Result Date: 05/11/2021 CLINICAL DATA:  Shortness of breath EXAM:  CHEST - 2 VIEW COMPARISON:  Chest x-ray 07/12/2019, CT chest 07/10/2019 FINDINGS: Heart size is normal. Mediastinum appears stable. Bilateral prominent opacities at the superior mediastinum consistent with the large thyroid gland as seen on CT. Calcified plaques in the aortic arch. Pulmonary vasculature is normal. No pleural effusion or pneumothorax. IMPRESSION: 1. No acute intrathoracic process identified. 2. Enlarged thyroid gland. Electronically Signed   By: Jannifer Hick M.D.   On: 05/11/2021  15:00   CT Angio Chest PE W/Cm &/Or Wo Cm  Result Date: 05/11/2021 CLINICAL DATA:  Shortness of breath, palpitations, cough, dizziness. History of DVT. EXAM: CT ANGIOGRAPHY CHEST WITH CONTRAST TECHNIQUE: Multidetector CT imaging of the chest was performed using the standard protocol during bolus administration of intravenous contrast. Multiplanar CT image reconstructions and MIPs were obtained to evaluate the vascular anatomy. CONTRAST:  75mL OMNIPAQUE IOHEXOL 350 MG/ML SOLN COMPARISON:  Chest radiographs dated 05/11/2021. CTA chest dated 07/10/2019. FINDINGS: Cardiovascular: Satisfactory opacification the bilateral pulmonary arteries to the segmental level. No evidence of pulmonary embolism. Although not tailored for evaluation of the thoracic aorta, there is no evidence of thoracic aortic aneurysm or dissection. Atherosclerotic calcifications of the arch. The heart is normal in size.  No pericardial effusion. Atherosclerotic calcifications of the arch. Mediastinum/Nodes: No suspicious mediastinal lymphadenopathy. Enlarged, heterogeneous thyroid compatible multinodular goiter. Dominant nodule measures 2.8 cm in the posterior right thyroid gland (series 6/image 17), unchanged from the prior. These findings were previously evaluated on prior thyroid ultrasound. This has been evaluated on previous imaging. (ref: J Am Coll Radiol. 2015 Feb;12(2): 143-50). Lungs/Pleura: Mild biapical pleural-parenchymal scarring. No focal consolidation. No suspicious pulmonary nodules. No pleural effusion or pneumothorax. Upper Abdomen: Visualized upper abdomen is notable for prior cholecystectomy. Musculoskeletal: Visualized osseous structures are within normal limits. Review of the MIP images confirms the above findings. IMPRESSION: No evidence of pulmonary embolism. No evidence of acute cardiopulmonary disease. Aortic Atherosclerosis (ICD10-I70.0). Electronically Signed   By: Charline Bills M.D.   On: 05/11/2021 22:11     Procedures Procedures   Medications Ordered in ED Medications  ipratropium-albuterol (DUONEB) 0.5-2.5 (3) MG/3ML nebulizer solution 3 mL (3 mLs Nebulization Given 05/11/21 1956)  iohexol (OMNIPAQUE) 350 MG/ML injection 75 mL (75 mLs Intravenous Contrast Given 05/11/21 2145)  albuterol (VENTOLIN HFA) 108 (90 Base) MCG/ACT inhaler 2 puff (2 puffs Inhalation Given 05/11/21 2233)    ED Course  I have reviewed the triage vital signs and the nursing notes.  Pertinent labs & imaging results that were available during my care of the patient were reviewed by me and considered in my medical decision making (see chart for details).    MDM Rules/Calculators/A&P                           Patient has had about a months worth of shortness of breath.  I did have concern for pulmonary embolus.  Patient had history of both lower extremity and upper extremity DVT.  She is no longer routinely anticoagulated.  PE study was negative.  No acute findings on PE study.  No evidence of any pneumonia or parenchymal changes patient EKG does not show any ischemic changes and no change from previous.  Troponins are negative.  Patient does have history of asthma but has not been using inhalers recently.  She is also experiencing dry cough.  Currently I recommend trying albuterol for cough and shortness of breath over the next several days.  No signs of acute  infection for antibiotic prescription.  Patient does not have any hypoxia.  She does not have significant wheezing and at this time do not feel that steroids are indicated and patient also reports allergy to prednisone.  Patient does take methimazole and had concern for her thyroid studies as her dose had been decreased.  Thyroid studies are pending.  She is not showing any clinical evidence of thyroid storm.  Patient is counseled to follow-up tomorrow morning with her endocrinologist and review thyroid studies from tonight.  Return precautions and follow-up plan reviewed.   All patient is also counseled to follow-up with her cardiologist.  She has a [facility] provider.  She reports she can call tomorrow to schedule follow-up. Final diagnoses:  Shortness of breath  Subacute cough  History of deep vein thrombosis (DVT) of lower extremity    Rx / DC Orders ED Discharge Orders     None        Arby Barrette, MD 05/11/21 2237

## 2021-05-12 LAB — TSH: TSH: <0.01 u[IU]/mL — ABNORMAL LOW (ref 0.350–4.500)

## 2021-05-13 LAB — T3, FREE: T3, Free: 17.2 pg/mL — ABNORMAL HIGH (ref 2.0–4.4)

## 2021-08-15 DIAGNOSIS — Z1231 Encounter for screening mammogram for malignant neoplasm of breast: Secondary | ICD-10-CM | POA: Diagnosis not present

## 2021-08-22 DIAGNOSIS — N1832 Chronic kidney disease, stage 3b: Secondary | ICD-10-CM | POA: Diagnosis not present

## 2021-08-29 DIAGNOSIS — M7061 Trochanteric bursitis, right hip: Secondary | ICD-10-CM | POA: Diagnosis not present

## 2021-08-29 DIAGNOSIS — M25552 Pain in left hip: Secondary | ICD-10-CM | POA: Diagnosis not present

## 2021-08-29 DIAGNOSIS — M25551 Pain in right hip: Secondary | ICD-10-CM | POA: Diagnosis not present

## 2021-08-29 DIAGNOSIS — M7062 Trochanteric bursitis, left hip: Secondary | ICD-10-CM | POA: Diagnosis not present

## 2021-08-29 DIAGNOSIS — N1832 Chronic kidney disease, stage 3b: Secondary | ICD-10-CM | POA: Diagnosis not present

## 2021-08-30 DIAGNOSIS — M7061 Trochanteric bursitis, right hip: Secondary | ICD-10-CM | POA: Diagnosis not present

## 2021-08-30 DIAGNOSIS — M7062 Trochanteric bursitis, left hip: Secondary | ICD-10-CM | POA: Diagnosis not present

## 2021-09-20 DIAGNOSIS — M810 Age-related osteoporosis without current pathological fracture: Secondary | ICD-10-CM | POA: Diagnosis not present

## 2021-09-21 DIAGNOSIS — E0590 Thyrotoxicosis, unspecified without thyrotoxic crisis or storm: Secondary | ICD-10-CM | POA: Diagnosis not present

## 2021-10-04 DIAGNOSIS — M810 Age-related osteoporosis without current pathological fracture: Secondary | ICD-10-CM | POA: Diagnosis not present

## 2021-10-17 DIAGNOSIS — E0590 Thyrotoxicosis, unspecified without thyrotoxic crisis or storm: Secondary | ICD-10-CM | POA: Diagnosis not present

## 2021-12-06 DIAGNOSIS — R0600 Dyspnea, unspecified: Secondary | ICD-10-CM | POA: Diagnosis not present

## 2021-12-06 DIAGNOSIS — N1832 Chronic kidney disease, stage 3b: Secondary | ICD-10-CM | POA: Diagnosis not present

## 2021-12-06 DIAGNOSIS — R0602 Shortness of breath: Secondary | ICD-10-CM | POA: Diagnosis not present

## 2021-12-06 DIAGNOSIS — E039 Hypothyroidism, unspecified: Secondary | ICD-10-CM | POA: Diagnosis not present

## 2021-12-06 DIAGNOSIS — I82A21 Chronic embolism and thrombosis of right axillary vein: Secondary | ICD-10-CM | POA: Diagnosis not present

## 2021-12-06 DIAGNOSIS — E0590 Thyrotoxicosis, unspecified without thyrotoxic crisis or storm: Secondary | ICD-10-CM | POA: Diagnosis not present

## 2021-12-14 DIAGNOSIS — E0590 Thyrotoxicosis, unspecified without thyrotoxic crisis or storm: Secondary | ICD-10-CM | POA: Diagnosis not present

## 2021-12-14 DIAGNOSIS — E010 Iodine-deficiency related diffuse (endemic) goiter: Secondary | ICD-10-CM | POA: Diagnosis not present

## 2022-01-03 DIAGNOSIS — E0590 Thyrotoxicosis, unspecified without thyrotoxic crisis or storm: Secondary | ICD-10-CM | POA: Diagnosis not present

## 2022-02-21 DIAGNOSIS — N1832 Chronic kidney disease, stage 3b: Secondary | ICD-10-CM | POA: Diagnosis not present

## 2022-02-26 DIAGNOSIS — N1832 Chronic kidney disease, stage 3b: Secondary | ICD-10-CM | POA: Diagnosis not present

## 2022-02-26 DIAGNOSIS — E669 Obesity, unspecified: Secondary | ICD-10-CM | POA: Diagnosis not present

## 2022-02-27 DIAGNOSIS — Z79899 Other long term (current) drug therapy: Secondary | ICD-10-CM | POA: Diagnosis not present

## 2022-02-27 DIAGNOSIS — E042 Nontoxic multinodular goiter: Secondary | ICD-10-CM | POA: Diagnosis not present

## 2022-02-27 DIAGNOSIS — E0590 Thyrotoxicosis, unspecified without thyrotoxic crisis or storm: Secondary | ICD-10-CM | POA: Diagnosis not present

## 2022-02-27 DIAGNOSIS — N1832 Chronic kidney disease, stage 3b: Secondary | ICD-10-CM | POA: Diagnosis not present

## 2022-05-08 DIAGNOSIS — E0590 Thyrotoxicosis, unspecified without thyrotoxic crisis or storm: Secondary | ICD-10-CM | POA: Diagnosis not present

## 2022-05-08 DIAGNOSIS — E042 Nontoxic multinodular goiter: Secondary | ICD-10-CM | POA: Diagnosis not present

## 2022-05-08 DIAGNOSIS — Z79899 Other long term (current) drug therapy: Secondary | ICD-10-CM | POA: Diagnosis not present

## 2022-06-04 DIAGNOSIS — J069 Acute upper respiratory infection, unspecified: Secondary | ICD-10-CM | POA: Diagnosis not present

## 2022-06-04 DIAGNOSIS — Z20822 Contact with and (suspected) exposure to covid-19: Secondary | ICD-10-CM | POA: Diagnosis not present

## 2022-08-09 DIAGNOSIS — Z79899 Other long term (current) drug therapy: Secondary | ICD-10-CM | POA: Diagnosis not present

## 2022-08-09 DIAGNOSIS — E0590 Thyrotoxicosis, unspecified without thyrotoxic crisis or storm: Secondary | ICD-10-CM | POA: Diagnosis not present

## 2022-08-09 DIAGNOSIS — E042 Nontoxic multinodular goiter: Secondary | ICD-10-CM | POA: Diagnosis not present

## 2022-08-09 DIAGNOSIS — N1832 Chronic kidney disease, stage 3b: Secondary | ICD-10-CM | POA: Diagnosis not present

## 2022-08-14 DIAGNOSIS — K080 Exfoliation of teeth due to systemic causes: Secondary | ICD-10-CM | POA: Diagnosis not present

## 2022-08-20 DIAGNOSIS — I82721 Chronic embolism and thrombosis of deep veins of right upper extremity: Secondary | ICD-10-CM | POA: Diagnosis not present

## 2022-08-20 DIAGNOSIS — E785 Hyperlipidemia, unspecified: Secondary | ICD-10-CM | POA: Diagnosis not present

## 2022-08-20 DIAGNOSIS — I82A21 Chronic embolism and thrombosis of right axillary vein: Secondary | ICD-10-CM | POA: Diagnosis not present

## 2022-08-20 DIAGNOSIS — R7301 Impaired fasting glucose: Secondary | ICD-10-CM | POA: Diagnosis not present

## 2022-08-20 DIAGNOSIS — I82B21 Chronic embolism and thrombosis of right subclavian vein: Secondary | ICD-10-CM | POA: Diagnosis not present

## 2022-08-20 DIAGNOSIS — Z0000 Encounter for general adult medical examination without abnormal findings: Secondary | ICD-10-CM | POA: Diagnosis not present

## 2022-08-20 DIAGNOSIS — Z23 Encounter for immunization: Secondary | ICD-10-CM | POA: Diagnosis not present

## 2022-08-20 DIAGNOSIS — N1832 Chronic kidney disease, stage 3b: Secondary | ICD-10-CM | POA: Diagnosis not present

## 2022-08-25 DIAGNOSIS — N1832 Chronic kidney disease, stage 3b: Secondary | ICD-10-CM | POA: Diagnosis not present

## 2022-08-28 DIAGNOSIS — N1832 Chronic kidney disease, stage 3b: Secondary | ICD-10-CM | POA: Diagnosis not present

## 2022-08-28 DIAGNOSIS — M25561 Pain in right knee: Secondary | ICD-10-CM | POA: Diagnosis not present

## 2022-08-28 DIAGNOSIS — M818 Other osteoporosis without current pathological fracture: Secondary | ICD-10-CM | POA: Diagnosis not present

## 2022-08-29 DIAGNOSIS — N1832 Chronic kidney disease, stage 3b: Secondary | ICD-10-CM | POA: Diagnosis not present

## 2022-09-19 DIAGNOSIS — M7061 Trochanteric bursitis, right hip: Secondary | ICD-10-CM | POA: Diagnosis not present

## 2022-09-19 DIAGNOSIS — M7062 Trochanteric bursitis, left hip: Secondary | ICD-10-CM | POA: Diagnosis not present

## 2022-11-19 ENCOUNTER — Ambulatory Visit
Admission: EM | Admit: 2022-11-19 | Discharge: 2022-11-19 | Disposition: A | Discharge status: Home / Self Care | Payer: Medicare Other | Attending: Family Medicine | Admitting: Family Medicine

## 2022-11-19 DIAGNOSIS — J0100 Acute maxillary sinusitis, unspecified: Secondary | ICD-10-CM

## 2022-11-19 MED ORDER — CEFUROXIME AXETIL 500 MG PO TABS: 500.0000 mg | ORAL_TABLET | Freq: Two times a day (BID) | ORAL | 0 refills | Status: AC

## 2022-11-19 NOTE — ED Triage Notes (Signed)
[facility] to [facility] with [facility] of sinus headache for 4 days. Pt has been using tessalon perls for cough.

## 2022-11-19 NOTE — Discharge Instructions (Addendum)
Instructed patient to take medication as directed with food to completion.  Encouraged increase daily water intake while taking this medication.  Advised if symptoms worsen and/or unresolved please follow-up with PCP for further evaluation

## 2022-11-19 NOTE — ED Provider Notes (Signed)
[facility]    CSN: [id number] Arrival date & time: 11/19/22  1647      History   Chief Complaint Chief Complaint  Patient presents with   Facial Pain   Cough    HPI Betty Atkinson is a 77 y.o. female.   HPI 77 year old female presents with sinus headaches and sinus nasal congestion for 4 days.  PMH significant for history of DVT, syncope, and CKD stage III.  Patient is accompanied by her husband this evening.  Patient reports that her PCP usually prescribes her Ceftin twice daily for sinus infection and request this antibiotic this evening.  Past Medical History:  Diagnosis Date   Allergic rhinitis    Asthma    DVT (deep venous thrombosis) (HCC) 2010   R leg, treated with coumadin x 6 months   GERD (gastroesophageal reflux disease)    Hearing disorder    R ear deafness   Hyperlipemia    Kidney calculi    recurrent, lithotripsy   Meniere's disease    Multinodular goiter    Osteoarthritis    Osteoporosis    Post-menopausal    Postural dizziness    postural syncope x 2   Renal insufficiency    Rosacea    Venous insufficiency of leg    post thrombotic syndrome   Vitamin D deficiency     Patient Active Problem List   Diagnosis Date Noted   Hyperthyroidism 07/13/2019   Fall 07/13/2019   DVT (deep venous thrombosis) (HCC) 07/13/2019   Chronic kidney disease, stage 3 (HCC) 07/13/2019   Intractable vomiting with nausea 07/12/2019   Need for prophylactic vaccination and inoculation against influenza 05/16/2013   Osteoarthritis 05/16/2013   Cellulitis of right arm 03/06/2013   Syncope 02/16/2013   Preoperative cardiovascular examination 02/16/2013   Shortness of breath 02/16/2013   Sinus tachycardia 02/16/2013   Asthma 02/08/2013   Potassium (K) deficiency 02/08/2013   Meniere's disease 02/08/2013   Weight gain 02/08/2013   Unspecified hypothyroidism 02/08/2013   History of DVT (deep vein thrombosis) 12/20/2011   H/O renal calculi 12/20/2011    Personal history of goiter 12/20/2011   DJD (degenerative joint disease) 12/20/2011   Other and unspecified hyperlipidemia 12/20/2011   GERD (gastroesophageal reflux disease) 12/20/2011   Menopause 12/20/2011   Allergic rhinitis 12/20/2011    Past Surgical History:  Procedure Laterality Date   CARDIAC CATHETERIZATION  2005   no CAD by cath at [facility]   LITHOTRIPSY     TONSILLECTOMY     TOTAL ABDOMINAL HYSTERECTOMY W/ BILATERAL SALPINGOOPHORECTOMY  1990    OB History     Gravida  2   Para  2   Term      Preterm      AB      Living  2      SAB      IAB      Ectopic      Multiple      Live Births               Home Medications    Prior to Admission medications   Medication Sig Start Date End Date Taking? Authorizing Provider  albuterol (PROVENTIL HFA;VENTOLIN HFA) 108 (90 BASE) MCG/ACT inhaler Inhale 1-2 puffs into the lungs every 6 (six) hours as needed for wheezing or shortness of breath.     [provider]  Azelaic Acid (FINACEA) 15 % cream Apply topically daily. After skin is thoroughly washed and patted dry,  gently but thoroughly massage a thin film of azelaic acid cream into the affected area twice daily, in the morning and evening.    [provider]  Bacillus Coagulans-Inulin (PROBIOTIC) 1-250 BILLION-MG CAPS Take 1 capsule by mouth daily. 07/02/19   Fawze, Mina A, PA-C  cefUROXime (CEFTIN) 500 MG tablet Take 1 tablet (500 mg total) by mouth 2 (two) times daily with a meal for 7 days. 11/19/22 11/26/22 Yes Trevor Iha, FNP  cetirizine (ZYRTEC) 10 MG tablet Take 10 mg by mouth daily.    [provider]  famotidine (PEPCID) 20 MG tablet Take 20 mg by mouth 2 (two) times daily. 06/10/19   [provider]  Magnesium 500 MG CAPS Take 1 capsule by mouth daily.      [provider]  methimazole (TAPAZOLE) 10 MG tablet Take 1 tablet (10 mg total) by mouth 2 (two) times daily. 07/14/19 08/13/19  Burnadette Pop, MD  metoprolol succinate (TOPROL-XL) 50 MG 24 hr tablet Take 1 tablet (50 mg total) by mouth daily. 07/14/19   Burnadette Pop, MD  mometasone (ASMANEX 14 METERED DOSES) 220 MCG/INH inhaler USE I TO 2 PUFF DAILY AT BED TIME    [provider]  mometasone (NASONEX) 50 MCG/ACT nasal spray Place 2 sprays into the nose at bedtime.      [provider]  montelukast (SINGULAIR) 10 MG tablet Take 10 mg by mouth at bedtime.    [provider]  Omeprazole Magnesium (PRILOSEC OTC PO) Take 1 tablet by mouth daily.     [provider]  potassium chloride (K-DUR) 10 MEQ tablet TAKE 1 TABLET BY MOUTH TWICE DAILY Patient taking differently: 10 mEq 2 (two) times daily.  04/05/13   Gillian Scarce, MD  promethazine (PHENERGAN) 12.5 MG suppository Place 12.5 mg rectally every 6 (six) hours as needed for nausea.    [provider]  Riboflavin (VITAMIN B-2 PO) Take 200 mg by mouth 4 (four) times daily.     [provider]  rosuvastatin (CRESTOR) 40 MG tablet Take 1/2 - 1 tab daily Patient taking differently: Take 20 mg by mouth daily.  03/12/13 07/12/19  Gillian Scarce, MD  Vitamin D, Ergocalciferol, (DRISDOL) 50000 UNITS CAPS Take 50,000 Units by mouth every 7 (seven) days. Take on Sunday     [provider]  vitamin E 400 UNIT capsule Take 400 Units by mouth daily.      [provider]    Family History Family History  Problem Relation Age of Onset   Thyroid disease Father    Heart disease Father    Heart disease Mother    Diabetes Mother    Cancer Brother        fibrohisteofibroma   Heart disease Brother    Thyroid disease Maternal Grandmother     Social History Social History   Tobacco Use   Smoking status: Never   Smokeless tobacco: Never  Substance Use Topics   Alcohol use: No   Drug use: No     Allergies   Methotrexate derivatives, Lyrica [pregabalin], Other, Prednisone, and Sulfa antibiotics   Review of  Systems Review of Systems  HENT:  Positive for congestion, sinus pressure and sinus pain.   All other systems reviewed and are negative.    Physical Exam Triage Vital Signs ED Triage Vitals  Enc Vitals Group     BP 11/19/22 1705 133/73     Pulse Rate 11/19/22 1705 98     Resp  11/19/22 1705 15     Temp 11/19/22 1705 97.7 F (36.5 C)     Temp src --      SpO2 11/19/22 1705 98 %     Weight --      Height --      Head Circumference --      Peak Flow --      Pain Score 11/19/22 1703 3     Pain Loc --      Pain Edu? --      Excl. in GC? --    No data found.  Updated Vital Signs BP 133/73   Pulse 98   Temp 97.7 F (36.5 C)   Resp 15   SpO2 98%       Physical Exam Vitals and nursing note reviewed.  Constitutional:      Appearance: Normal appearance. She is normal weight.  HENT:     Head: Normocephalic and atraumatic.     Right Ear: Tympanic membrane and external ear normal.     Left Ear: Tympanic membrane and external ear normal.     Ears:     Comments: Moderate eustachian tube dysfunction noted bilaterally    Nose:     Right Sinus: Maxillary sinus tenderness present.     Left Sinus: Maxillary sinus tenderness present.     Mouth/Throat:     Mouth: Mucous membranes are moist.     Pharynx: Oropharynx is clear.  Eyes:     Extraocular Movements: Extraocular movements intact.     Conjunctiva/sclera: Conjunctivae normal.     Pupils: Pupils are equal, round, and reactive to light.  Cardiovascular:     Rate and Rhythm: Normal rate and regular rhythm.     Pulses: Normal pulses.     Heart sounds: Normal heart sounds.  Pulmonary:     Effort: Pulmonary effort is normal.     Breath sounds: Normal breath sounds. No wheezing, rhonchi or rales.  Musculoskeletal:        General: Normal range of motion.     Cervical back: Normal range of motion and neck supple.  Skin:    General: Skin is warm and dry.  Neurological:     General: No focal deficit present.     Mental  Status: She is alert and oriented to person, place, and time. Mental status is at baseline.      UC Treatments / Results  Labs (all labs ordered are listed, but only abnormal results are displayed) Labs Reviewed - No data to display  EKG   Radiology No results found.  Procedures Procedures (including critical care time)  Medications Ordered in UC Medications - No data to display  Initial Impression / Assessment and Plan / UC Course  I have reviewed the triage vital signs and the nursing notes.  Pertinent labs & imaging results that were available during my care of the patient were reviewed by me and considered in my medical decision making (see chart for details).     MDM: 1.  Acute maxillary sinusitis, recurrence not specified-Rx'd Ceftin (per patient request) 500 mg twice daily x 7 days. Instructed patient to take medication as directed with food to completion.  Encouraged increase daily water intake while taking this medication.  Advised if symptoms worsen and/or unresolved please follow-up with PCP for further evaluation.  Patient discharged home, hemodynamically stable. Final Clinical Impressions(s) / UC Diagnoses   Final diagnoses:  Acute maxillary sinusitis, recurrence not specified     Discharge Instructions  Instructed patient to take medication as directed with food to completion.  Encouraged increase daily water intake while taking this medication.  Advised if symptoms worsen and/or unresolved please follow-up with PCP for further evaluation     ED Prescriptions     Medication Sig Dispense Auth. Provider   cefUROXime (CEFTIN) 500 MG tablet Take 1 tablet (500 mg total) by mouth 2 (two) times daily with a meal for 7 days. 14 tablet Trevor Iha, FNP      PDMP not reviewed this encounter.   Trevor Iha, FNP 11/19/22 1750

## 2023-05-01 DIAGNOSIS — K080 Exfoliation of teeth due to systemic causes: Secondary | ICD-10-CM | POA: Diagnosis not present

## 2023-05-19 ENCOUNTER — Ambulatory Visit
Admission: RE | Admit: 2023-05-19 | Discharge: 2023-05-19 | Disposition: A | Discharge status: Home / Self Care | Payer: Medicare Other | Source: Ambulatory Visit | Attending: Family Medicine | Admitting: Family Medicine

## 2023-05-19 ENCOUNTER — Other Ambulatory Visit: Payer: Self-pay

## 2023-05-19 VITALS — BP 136/75 | HR 86 | Temp 98.5°F | Resp 16

## 2023-05-19 DIAGNOSIS — J0101 Acute recurrent maxillary sinusitis: Secondary | ICD-10-CM | POA: Diagnosis not present

## 2023-05-19 DIAGNOSIS — J302 Other seasonal allergic rhinitis: Secondary | ICD-10-CM | POA: Diagnosis not present

## 2023-05-19 DIAGNOSIS — J4541 Moderate persistent asthma with (acute) exacerbation: Secondary | ICD-10-CM

## 2023-05-19 LAB — POC SARS CORONAVIRUS 2 AG -  ED: SARS Coronavirus 2 Ag: NEGATIVE

## 2023-05-19 MED ORDER — CEFUROXIME AXETIL 250 MG PO TABS: 500.0000 mg | ORAL_TABLET | Freq: Two times a day (BID) | ORAL | 0 refills | Status: DC

## 2023-05-19 MED ORDER — BENZONATATE 200 MG PO CAPS: 200.0000 mg | ORAL_CAPSULE | Freq: Three times a day (TID) | ORAL | 0 refills | Status: DC | PRN

## 2023-05-19 NOTE — ED Provider Notes (Signed)
[facility]    CSN: [id number] Arrival date & time: 05/19/23  1406      History   Chief Complaint Chief Complaint  Patient presents with   Nasal Congestion    Sinus infection - Entered by patient    HPI Betty Atkinson is a 77 y.o. female.   HPI  Patient indicates that she has recurring sinus infection 2 a year.  She states that this week she has been taking Christmas decorations out of a dusty basement and has developed sore throat, sinus pressure, cough, increased shortness of breath and increased need for her asthma inhaler.  She is feeling "lousy " She is taking over-the-counter allergy medicines, inhalers.  In spite of this she states that is getting worse.  She "knows she will need an antibiotic" in order to get better  Past Medical History:  Diagnosis Date   Allergic rhinitis    Asthma    DVT (deep venous thrombosis) (HCC) 2010   R leg, treated with coumadin x 6 months   GERD (gastroesophageal reflux disease)    Hearing disorder    R ear deafness   Hyperlipemia    Kidney calculi    recurrent, lithotripsy   Meniere's disease    Multinodular goiter    Osteoarthritis    Osteoporosis    Post-menopausal    Postural dizziness    postural syncope x 2   Renal insufficiency    Rosacea    Venous insufficiency of leg    post thrombotic syndrome   Vitamin D deficiency     Patient Active Problem List   Diagnosis Date Noted   Hyperthyroidism 07/13/2019   Fall 07/13/2019   DVT (deep venous thrombosis) (HCC) 07/13/2019   Chronic kidney disease, stage 3 (HCC) 07/13/2019   Intractable vomiting with nausea 07/12/2019   Need for prophylactic vaccination and inoculation against influenza 05/16/2013   Osteoarthritis 05/16/2013   Cellulitis of right arm 03/06/2013   Syncope 02/16/2013   Preoperative cardiovascular examination 02/16/2013   Shortness of breath 02/16/2013   Sinus tachycardia 02/16/2013   Asthma 02/08/2013   Potassium (K) deficiency 02/08/2013    Meniere's disease 02/08/2013   Weight gain 02/08/2013   Hypothyroidism 02/08/2013   History of DVT (deep vein thrombosis) 12/20/2011   H/O renal calculi 12/20/2011   Personal history of goiter 12/20/2011   DJD (degenerative joint disease) 12/20/2011   Other and unspecified hyperlipidemia 12/20/2011   GERD (gastroesophageal reflux disease) 12/20/2011   Menopause 12/20/2011   Allergic rhinitis 12/20/2011    Past Surgical History:  Procedure Laterality Date   CARDIAC CATHETERIZATION  2005   no CAD by cath at [facility]   LITHOTRIPSY     TONSILLECTOMY     TOTAL ABDOMINAL HYSTERECTOMY W/ BILATERAL SALPINGOOPHORECTOMY  1990    OB History     Gravida  2   Para  2   Term      Preterm      AB      Living  2      SAB      IAB      Ectopic      Multiple      Live Births               Home Medications    Prior to Admission medications   Medication Sig Start Date End Date Taking? Authorizing Provider  aspirin 81 MG chewable tablet Chew by mouth daily.   Yes [provider]  benzonatate (  TESSALON) 200 MG capsule Take 1 capsule (200 mg total) by mouth 3 (three) times daily as needed for cough. 05/19/23  Yes Eustace Moore, MD  cefUROXime (CEFTIN) 250 MG tablet Take 2 tablets (500 mg total) by mouth 2 (two) times daily with a meal. 05/19/23  Yes Eustace Moore, MD  albuterol (PROVENTIL HFA;VENTOLIN HFA) 108 (90 BASE) MCG/ACT inhaler Inhale 1-2 puffs into the lungs every 6 (six) hours as needed for wheezing or shortness of breath.     [provider]  Azelaic Acid (FINACEA) 15 % cream Apply topically daily. After skin is thoroughly washed and patted dry, gently but thoroughly massage a thin film of azelaic acid cream into the affected area twice daily, in the morning and evening.    [provider]  Bacillus Coagulans-Inulin (PROBIOTIC) 1-250 BILLION-MG CAPS Take 1 capsule by mouth daily. 07/02/19   Fawze, Mina A, PA-C   cetirizine (ZYRTEC) 10 MG tablet Take 10 mg by mouth daily.    [provider]  famotidine (PEPCID) 20 MG tablet Take 20 mg by mouth 2 (two) times daily. 06/10/19   [provider]  Magnesium 500 MG CAPS Take 1 capsule by mouth daily.      [provider]  metoprolol succinate (TOPROL-XL) 50 MG 24 hr tablet Take 1 tablet (50 mg total) by mouth daily. 07/14/19   Burnadette Pop, MD  mometasone (ASMANEX 14 METERED DOSES) 220 MCG/INH inhaler USE I TO 2 PUFF DAILY AT BED TIME    [provider]  mometasone (NASONEX) 50 MCG/ACT nasal spray Place 2 sprays into the nose at bedtime.      [provider]  montelukast (SINGULAIR) 10 MG tablet Take 10 mg by mouth at bedtime.    [provider]  Omeprazole Magnesium (PRILOSEC OTC PO) Take 1 tablet by mouth daily.     [provider]  potassium chloride (K-DUR) 10 MEQ tablet TAKE 1 TABLET BY MOUTH TWICE DAILY Patient taking differently: 10 mEq 2 (two) times daily.  04/05/13   Gillian Scarce, MD  Riboflavin (VITAMIN B-2 PO) Take 200 mg by mouth 4 (four) times daily.     [provider]  rosuvastatin (CRESTOR) 40 MG tablet Take 1/2 - 1 tab daily Patient taking differently: Take 20 mg by mouth daily.  03/12/13 07/12/19  Gillian Scarce, MD  Vitamin D, Ergocalciferol, (DRISDOL) 50000 UNITS CAPS Take 50,000 Units by mouth every 7 (seven) days. Take on Sunday     [provider]  vitamin E 400 UNIT capsule Take 400 Units by mouth daily.      [provider]    Family History Family History  Problem Relation Age of Onset   Thyroid disease Father    Heart disease Father    Heart disease Mother    Diabetes Mother    Cancer Brother        fibrohisteofibroma   Heart disease Brother    Thyroid disease Maternal Grandmother     Social History Social History   Tobacco Use   Smoking status: Never   Smokeless tobacco: Never  Substance Use Topics   Alcohol use: No   Drug use:  No     Allergies   Methotrexate derivatives, Lyrica [pregabalin], Other, Prednisone, and Sulfa antibiotics   Review of Systems Review of Systems See HPI  Physical Exam Triage Vital Signs ED Triage Vitals  Encounter Vitals Group     BP 05/19/23 1424 136/75  Systolic BP Percentile --      Diastolic BP Percentile --      Pulse Rate 05/19/23 1424 86     Resp 05/19/23 1424 16     Temp 05/19/23 1424 98.5 F (36.9 C)     Temp Source 05/19/23 1424 Oral     SpO2 05/19/23 1424 94 %     Weight --      Height --      Head Circumference --      Peak Flow --      Pain Score 05/19/23 1427 8     Pain Loc --      Pain Education --      Exclude from Growth Chart --    No data found.  Updated Vital Signs BP 136/75   Pulse 86   Temp 98.5 F (36.9 C) (Oral)   Resp 16   SpO2 94%      Physical Exam Constitutional:      General: She is not in acute distress.    Appearance: Normal appearance. She is well-developed and normal weight. She is ill-appearing.     Comments: Frail, ill-appearing  HENT:     Head: Normocephalic and atraumatic.     Right Ear: Tympanic membrane and ear canal normal.     Left Ear: Tympanic membrane and ear canal normal.     Nose: Congestion and rhinorrhea present.     Mouth/Throat:     Mouth: Mucous membranes are moist.     Pharynx: Posterior oropharyngeal erythema present.  Eyes:     Conjunctiva/sclera: Conjunctivae normal.     Pupils: Pupils are equal, round, and reactive to light.  Cardiovascular:     Rate and Rhythm: Normal rate and regular rhythm.     Heart sounds: Murmur heard.  Pulmonary:     Effort: Pulmonary effort is normal. No respiratory distress.     Breath sounds: Normal breath sounds.  Abdominal:     General: There is no distension.     Palpations: Abdomen is soft.  Musculoskeletal:        General: Normal range of motion.     Cervical back: Normal range of motion.  Lymphadenopathy:     Cervical: Cervical adenopathy present.   Skin:    General: Skin is warm and dry.  Neurological:     Mental Status: She is alert.      UC Treatments / Results  Labs (all labs ordered are listed, but only abnormal results are displayed) Labs Reviewed  POC SARS CORONAVIRUS 2 AG -  ED    EKG   Radiology No results found.  Procedures Procedures (including critical care time)  Medications Ordered in UC Medications - No data to display  Initial Impression / Assessment and Plan / UC Course  I have reviewed the triage vital signs and the nursing notes.  Pertinent labs & imaging results that were available during my care of the patient were reviewed by me and considered in my medical decision making (see chart for details).     I discussed trying prednisone for her symptoms.  I let her know this often works for a flare of asthma.  Patient insist she is allergic to all prednisone and.  She is also allergic to a number of antibiotics.  She has a written note that requests Ceftin 500 mg (dispensed is to 250 mg pills) and Tessalon. COVID testing is negative.  Final Clinical Impressions(s) / UC Diagnoses   Final diagnoses:  Seasonal  allergies  Moderate persistent asthma with acute exacerbation  Acute recurrent maxillary sinusitis     Discharge Instructions      Take the Ceftin 500 mg 3 times a day as directed Continue your allergy medicines and inhalers Take Tessalon as needed for cough Drink lots of fluids Run a humidifier See your doctor if not better by next week COVID test is negative   ED Prescriptions     Medication Sig Dispense Auth. Provider   cefUROXime (CEFTIN) 250 MG tablet Take 2 tablets (500 mg total) by mouth 2 (two) times daily with a meal. 28 tablet Eustace Moore, MD   benzonatate (TESSALON) 200 MG capsule Take 1 capsule (200 mg total) by mouth 3 (three) times daily as needed for cough. 21 capsule Eustace Moore, MD      PDMP not reviewed this encounter.   Eustace Moore,  MD 05/19/23 [id number]

## 2023-05-19 NOTE — Discharge Instructions (Addendum)
Take the Ceftin 500 mg 3 times a day as directed Continue your allergy medicines and inhalers Take Tessalon as needed for cough Drink lots of fluids Run a humidifier See your doctor if not better by next week COVID test is negative

## 2023-05-19 NOTE — ED Triage Notes (Signed)
Cough, sore throat, sinus pressure, nasal congestion, feeling "lousy" since Friday. No fever.

## 2023-06-13 ENCOUNTER — Telehealth: Payer: Self-pay | Admitting: Physician Assistant

## 2023-06-13 DIAGNOSIS — N1832 Chronic kidney disease, stage 3b: Secondary | ICD-10-CM | POA: Diagnosis not present

## 2023-06-13 NOTE — Telephone Encounter (Signed)
Copied from CRM [id number]. Topic: Appointments - Scheduling Inquiry for Clinic >> Jun 13, 2023  2:52 PM Betty Atkinson wrote: Reason for CRM: Patient stated that Dr. Caleen Essex said she would be willing to take her on as a patient since her daughter Betty Atkinson is already a patient of hers. She is requesting an appt and to get a referral for a doctor regarding her thyroid. Contact# [phone]

## 2023-06-17 DIAGNOSIS — N1832 Chronic kidney disease, stage 3b: Secondary | ICD-10-CM | POA: Diagnosis not present

## 2023-06-18 ENCOUNTER — Ambulatory Visit (INDEPENDENT_AMBULATORY_CARE_PROVIDER_SITE_OTHER): Payer: Medicare Other | Admitting: Physician Assistant

## 2023-06-18 ENCOUNTER — Encounter: Payer: Self-pay | Admitting: Physician Assistant

## 2023-06-18 VITALS — BP 148/78 | HR 69 | Resp 12 | Wt 146.9 lb

## 2023-06-18 DIAGNOSIS — K219 Gastro-esophageal reflux disease without esophagitis: Secondary | ICD-10-CM

## 2023-06-18 DIAGNOSIS — N1832 Chronic kidney disease, stage 3b: Secondary | ICD-10-CM

## 2023-06-18 DIAGNOSIS — M810 Age-related osteoporosis without current pathological fracture: Secondary | ICD-10-CM | POA: Insufficient documentation

## 2023-06-18 DIAGNOSIS — E0590 Thyrotoxicosis, unspecified without thyrotoxic crisis or storm: Secondary | ICD-10-CM

## 2023-06-18 DIAGNOSIS — R000 Tachycardia, unspecified: Secondary | ICD-10-CM

## 2023-06-18 DIAGNOSIS — Z87442 Personal history of urinary calculi: Secondary | ICD-10-CM

## 2023-06-18 DIAGNOSIS — Z86718 Personal history of other venous thrombosis and embolism: Secondary | ICD-10-CM

## 2023-06-18 DIAGNOSIS — Z8719 Personal history of other diseases of the digestive system: Secondary | ICD-10-CM | POA: Insufficient documentation

## 2023-06-18 DIAGNOSIS — J4540 Moderate persistent asthma, uncomplicated: Secondary | ICD-10-CM

## 2023-06-18 MED ORDER — ROSUVASTATIN CALCIUM 20 MG PO TABS
20.0000 mg | ORAL_TABLET | Freq: Every day | ORAL | 0 refills | Status: DC
Start: 2023-06-18 — End: 2023-10-14

## 2023-06-18 MED ORDER — METOPROLOL SUCCINATE ER 50 MG PO TB24: 50.0000 mg | ORAL_TABLET | Freq: Every day | ORAL | 1 refills | Status: DC

## 2023-06-18 NOTE — Patient Instructions (Signed)
Consider getting shingrix and tdap at pharmacy Medicare wellness exam in January Made referral to endocrinology

## 2023-06-18 NOTE — Progress Notes (Signed)
New Patient Office Visit  Subjective    Patient ID: Betty Atkinson, female    DOB: February 11, 1946  Age: 77 y.o. MRN: [id number]  CC:  Chief Complaint  Patient presents with   Establish Care    HPI Betty Atkinson presents to establish care.   She sees Dr. Roanna Raider (endo) for hyper/hypothyroidism and is currently on methimazole. She wants a referral to a new endocrinologist due to her current one's location being too far.  She sees a nephrologist to manage her stage 3 CKD. She had kidney function labs taken last week.  BP elevated today. Patient reports yesterday's BP was ~124/70s at the doctor's office. She does not take at home BPs. Reports increased stress due to her daughter being in the hospital.   She is on a statin but has not had a lipid panel in at least several months.  PMH of OP and takes vit D and gets DEXA scan every other year.  Her sleep is variable depending on stress level.   Outpatient Encounter Medications as of 06/18/2023  Medication Sig   albuterol (PROVENTIL HFA;VENTOLIN HFA) 108 (90 BASE) MCG/ACT inhaler Inhale 1-2 puffs into the lungs every 6 (six) hours as needed for wheezing or shortness of breath.    aspirin 81 MG chewable tablet Chew by mouth daily.   Azelaic Acid (FINACEA) 15 % cream Apply topically daily. After skin is thoroughly washed and patted dry, gently but thoroughly massage a thin film of azelaic acid cream into the affected area twice daily, in the morning and evening.   cetirizine (ZYRTEC) 10 MG tablet Take 10 mg by mouth daily.   famotidine (PEPCID) 20 MG tablet Take 20 mg by mouth 2 (two) times daily.   Magnesium 500 MG CAPS Take 1 capsule by mouth daily.     mometasone (ASMANEX 14 METERED DOSES) 220 MCG/INH inhaler USE I TO 2 PUFF DAILY AT BED TIME   mometasone (NASONEX) 50 MCG/ACT nasal spray Place 2 sprays into the nose at bedtime.     montelukast (SINGULAIR) 10 MG tablet Take 10 mg by mouth at bedtime.   Omeprazole Magnesium (PRILOSEC OTC PO) Take 1  tablet by mouth daily.    potassium chloride (K-DUR) 10 MEQ tablet TAKE 1 TABLET BY MOUTH TWICE DAILY (Patient taking differently: 10 mEq 2 (two) times daily.)   Vitamin D, Ergocalciferol, (DRISDOL) 50000 UNITS CAPS Take 50,000 Units by mouth every 7 (seven) days. Take on Sunday    vitamin E 400 UNIT capsule Take 400 Units by mouth daily.     [DISCONTINUED] Bacillus Coagulans-Inulin (PROBIOTIC) 1-250 BILLION-MG CAPS Take 1 capsule by mouth daily.   [DISCONTINUED] metoprolol succinate (TOPROL-XL) 50 MG 24 hr tablet Take 1 tablet (50 mg total) by mouth daily.   [DISCONTINUED] Riboflavin (VITAMIN B-2 PO) Take 200 mg by mouth 4 (four) times daily.    metoprolol succinate (TOPROL-XL) 50 MG 24 hr tablet Take 1 tablet (50 mg total) by mouth daily.   rosuvastatin (CRESTOR) 40 MG tablet Take 1/2 - 1 tab daily (Patient taking differently: Take 20 mg by mouth daily. )   [DISCONTINUED] benzonatate (TESSALON) 200 MG capsule Take 1 capsule (200 mg total) by mouth 3 (three) times daily as needed for cough.   [DISCONTINUED] cefUROXime (CEFTIN) 250 MG tablet Take 2 tablets (500 mg total) by mouth 2 (two) times daily with a meal.   No facility-administered encounter medications on file as of 06/18/2023.    Past Medical History:  Diagnosis Date  Allergic rhinitis    Asthma    DVT (deep venous thrombosis) (HCC) 2010   R leg, treated with coumadin x 6 months   GERD (gastroesophageal reflux disease)    Hearing disorder    R ear deafness   Hyperlipemia    Kidney calculi    recurrent, lithotripsy   Meniere's disease    Multinodular goiter    Osteoarthritis    Osteoporosis    Post-menopausal    Postural dizziness    postural syncope x 2   Renal insufficiency    Rosacea    Venous insufficiency of leg    post thrombotic syndrome   Vitamin D deficiency     Past Surgical History:  Procedure Laterality Date   CARDIAC CATHETERIZATION  2005   no CAD by cath at [facility]   LITHOTRIPSY      TONSILLECTOMY     TOTAL ABDOMINAL HYSTERECTOMY W/ BILATERAL SALPINGOOPHORECTOMY  1990    Family History  Problem Relation Age of Onset   Thyroid disease Father    Heart disease Father    Heart disease Mother    Diabetes Mother    Cancer Brother        fibrohisteofibroma   Heart disease Brother    Thyroid disease Maternal Grandmother     Social History   Socioeconomic History   Marital status: Married    Spouse name: MICHAEL   Number of children: 2   Years of education: 12   Highest education level: Not on file  Occupational History   Occupation: Homemaker  Tobacco Use   Smoking status: Never   Smokeless tobacco: Never  Substance and Sexual Activity   Alcohol use: No   Drug use: No   Sexual activity: Yes    Partners: Male  Other Topics Concern   Not on file  Social History Narrative   Marital Status: Married [name])    Children:  Daughter [name]) Son [name])   Pets:  None    Living Situation: Lives with spouse in [place]   Occupation: Homemaker    Education:  Engineer, agricultural    Alcohol Use:  None    Drug Use:  None    Diet:  Low Salt    Exercise:  None       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   Food    Within the past 12 months, you worried that your food would run out before you got money to buy more food: Never true    Within the past 12 months, the food you bought just didn't last and you didn't have money to get more: Never true  Transportation Needs: No Transportation Needs (08/20/2022)   Received from [facility] visits prior to 09/08/2022., [facility] visits prior to 09/08/2022.   Transportation    In the past 12  months, has lack of reliable transportation kept you from medical appointments, meetings, work or from getting things needed for daily living?: No  Physical Activity: Not on file  Stress: Not on file  Social Connections: Not on file  Intimate Partner Violence: Low Risk  (08/20/2022)  [facility] prior to 09/08/2022., [facility] visits prior to 09/08/2022.   Safety    How often does anyone, including family and friends, physically hurt you?: Never    How often does anyone, including family and friends, insult or talk down to you?: Never    How often does anyone, including family and friends, threaten you with harm?: Never    Think about the place you live. Do you have problems with any of the following? Choose all that apply:: Not on file    Review of Systems  Respiratory:  Negative for shortness of breath.   Cardiovascular:  Negative for chest pain.        Objective    BP (!) 148/78 (BP Location: Right Arm)   Pulse 69   Resp 12   Wt 146 lb 14.4 oz (66.6 kg)   SpO2 95%   BMI 28.69 kg/m   Physical Exam Cardiovascular:     Rate and Rhythm: Normal rate and regular rhythm.     Pulses: Normal pulses.     Heart sounds: Normal heart sounds.  Pulmonary:     Effort: Pulmonary effort is normal.     Breath sounds: Normal breath sounds.  Musculoskeletal:     Right lower leg: Edema present.     Left lower leg: Edema present.  Neurological:     General: No focal deficit present.     Mental Status: She is alert and oriented to person, place, and time.       Assessment & Plan:  Marland KitchenMarland KitchenNovaly was seen today for establish care.  Diagnoses and all orders for this visit:  Sinus tachycardia -     metoprolol succinate (TOPROL-XL) 50 MG 24 hr tablet; Take 1 tablet (50 mg total) by mouth daily.  Stage 3b chronic kidney disease (HCC)  Hyperthyroidism -     Ambulatory referral to Endocrinology  History of DVT (deep vein  thrombosis)  History of kidney stones  Age-related osteoporosis without current pathological fracture  History of diverticulitis  Gastroesophageal reflux disease without esophagitis  Moderate persistent asthma without complication   Referral to new endo.  Followed by nephrologist for CKD.  On statin-will follow up first of year for labs.   Continue compression stockings for ankle edema.  BP elevated today. In past has been well controlled and yesterday's measurement in 120s/70s. Advised patient to continue to monitor, limit salt intake, and practice stress reduction techniques.  Wants flu vaccine after Christmas. Declined COVID vaccine. She has never had a colonoscopy and is not interested; discussed with patient Cologuard and patient said she would "think about it".  Had hysterectomy- no pap needed. She needs mammograms every other year. Reports she has had one Shingles vaccine; recommended getting another one at her local pharmacy. Patient needs to schedule Medicare Wellness exam.  F/u every 6 mo.   Return in about 6 months (around 12/17/2023) for Follow up, Needs to schedule Medicare Wellness Exam.   Tandy Gaw, PA-C

## 2023-07-16 ENCOUNTER — Ambulatory Visit (INDEPENDENT_AMBULATORY_CARE_PROVIDER_SITE_OTHER): Payer: Medicare Other

## 2023-07-16 ENCOUNTER — Encounter: Payer: Medicare Other | Admitting: Family Medicine

## 2023-07-16 ENCOUNTER — Telehealth: Payer: Self-pay

## 2023-07-16 DIAGNOSIS — Z23 Encounter for immunization: Secondary | ICD-10-CM

## 2023-07-16 NOTE — Telephone Encounter (Signed)
 Copied from CRM [id number]. Topic: General - Billing Inquiry >> Jul 15, 2023  9:46 AM Jorje Guild R wrote: Reason for CRM: Patient wants to know if Insurance will cover Wellness visit being that it hasntr been over 1 year as the appointment scheduled.

## 2023-07-16 NOTE — Telephone Encounter (Signed)
 Patient rescheduled.

## 2023-08-01 ENCOUNTER — Other Ambulatory Visit: Payer: Self-pay | Admitting: Physician Assistant

## 2023-08-01 NOTE — Telephone Encounter (Signed)
Copied from CRM [id number]. Topic: Clinical - Prescription Issue >> Aug 01, 2023 12:22 PM Alvino Blood C wrote: Reason for CRM: Patient was advised by the pharmacy she needs a refill on the following medications: famotidine (PEPCID) 20 MG tablet. There a no remaining refills on the script.

## 2023-08-26 ENCOUNTER — Encounter: Payer: Medicare Other | Admitting: Family Medicine

## 2023-08-27 ENCOUNTER — Encounter: Payer: Self-pay | Admitting: Physician Assistant

## 2023-08-27 ENCOUNTER — Ambulatory Visit (INDEPENDENT_AMBULATORY_CARE_PROVIDER_SITE_OTHER): Payer: Medicare Other | Admitting: Physician Assistant

## 2023-08-27 VITALS — BP 121/55 | HR 70 | Ht 60.0 in | Wt 149.0 lb

## 2023-08-27 DIAGNOSIS — Z13220 Encounter for screening for lipoid disorders: Secondary | ICD-10-CM | POA: Diagnosis not present

## 2023-08-27 DIAGNOSIS — M25562 Pain in left knee: Secondary | ICD-10-CM

## 2023-08-27 DIAGNOSIS — M810 Age-related osteoporosis without current pathological fracture: Secondary | ICD-10-CM

## 2023-08-27 DIAGNOSIS — Z1231 Encounter for screening mammogram for malignant neoplasm of breast: Secondary | ICD-10-CM

## 2023-08-27 DIAGNOSIS — Z0000 Encounter for general adult medical examination without abnormal findings: Secondary | ICD-10-CM | POA: Diagnosis not present

## 2023-08-27 DIAGNOSIS — M7071 Other bursitis of hip, right hip: Secondary | ICD-10-CM

## 2023-08-27 DIAGNOSIS — Z131 Encounter for screening for diabetes mellitus: Secondary | ICD-10-CM | POA: Diagnosis not present

## 2023-08-27 DIAGNOSIS — N1831 Chronic kidney disease, stage 3a: Secondary | ICD-10-CM

## 2023-08-27 DIAGNOSIS — Z86718 Personal history of other venous thrombosis and embolism: Secondary | ICD-10-CM | POA: Diagnosis not present

## 2023-08-27 DIAGNOSIS — G8929 Other chronic pain: Secondary | ICD-10-CM

## 2023-08-27 DIAGNOSIS — E0590 Thyrotoxicosis, unspecified without thyrotoxic crisis or storm: Secondary | ICD-10-CM

## 2023-08-27 DIAGNOSIS — F4321 Adjustment disorder with depressed mood: Secondary | ICD-10-CM

## 2023-08-27 DIAGNOSIS — G479 Sleep disorder, unspecified: Secondary | ICD-10-CM

## 2023-08-27 DIAGNOSIS — M25561 Pain in right knee: Secondary | ICD-10-CM | POA: Diagnosis not present

## 2023-08-27 NOTE — Patient Instructions (Addendum)
 Update TDAP and Shingles vaccine at the pharmacy.   Health Maintenance After Age 79 After age 29, you are at a higher risk for certain long-term diseases and infections as well as injuries from falls. Falls are a major cause of broken bones and head injuries in people who are older than age 47. Getting regular preventive care can help to keep you healthy and well. Preventive care includes getting regular testing and making lifestyle changes as recommended by your health care provider. Talk with your health care provider about: Which screenings and tests you should have. A screening is a test that checks for a disease when you have no symptoms. A diet and exercise plan that is right for you. What should I know about screenings and tests to prevent falls? Screening and testing are the best ways to find a health problem early. Early diagnosis and treatment give you the best chance of managing medical conditions that are common after age 20. Certain conditions and lifestyle choices may make you more likely to have a fall. Your health care provider may recommend: Regular vision checks. Poor vision and conditions such as cataracts can make you more likely to have a fall. If you wear glasses, make sure to get your prescription updated if your vision changes. Medicine review. Work with your health care provider to regularly review all of the medicines you are taking, including over-the-counter medicines. Ask your health care provider about any side effects that may make you more likely to have a fall. Tell your health care provider if any medicines that you take make you feel dizzy or sleepy. Strength and balance checks. Your health care provider may recommend certain tests to check your strength and balance while standing, walking, or changing positions. Foot health exam. Foot pain and numbness, as well as not wearing proper footwear, can make you more likely to have a fall. Screenings, including: Osteoporosis  screening. Osteoporosis is a condition that causes the bones to get weaker and break more easily. Blood pressure screening. Blood pressure changes and medicines to control blood pressure can make you feel dizzy. Depression screening. You may be more likely to have a fall if you have a fear of falling, feel depressed, or feel unable to do activities that you used to do. Alcohol use screening. Using too much alcohol can affect your balance and may make you more likely to have a fall. Follow these instructions at home: Lifestyle Do not drink alcohol if: Your health care provider tells you not to drink. If you drink alcohol: Limit how much you have to: 0-1 drink a day for women. 0-2 drinks a day for men. Know how much alcohol is in your drink. In the [place] one drink equals one 12 oz bottle of beer (355 mL), one 5 oz glass of wine (148 mL), or one 1 oz glass of hard liquor (44 mL). Do not use any products that contain nicotine or tobacco. These products include cigarettes, chewing tobacco, and vaping devices, such as e-cigarettes. If you need help quitting, ask your health care provider. Activity  Follow a regular exercise program to stay fit. This will help you maintain your balance. Ask your health care provider what types of exercise are appropriate for you. If you need a cane or walker, use it as recommended by your health care provider. Wear supportive shoes that have nonskid soles. Safety  Remove any tripping hazards, such as rugs, cords, and clutter. Install safety equipment such as grab bars in  bathrooms and safety rails on stairs. Keep rooms and walkways well-lit. General instructions Talk with your health care provider about your risks for falling. Tell your health care provider if: You fall. Be sure to tell your health care provider about all falls, even ones that seem minor. You feel dizzy, tiredness (fatigue), or off-balance. Take over-the-counter and prescription medicines only  as told by your health care provider. These include supplements. Eat a healthy diet and maintain a healthy weight. A healthy diet includes low-fat dairy products, low-fat (lean) meats, and fiber from whole grains, beans, and lots of fruits and vegetables. Stay current with your vaccines. Schedule regular health, dental, and eye exams. Summary Having a healthy lifestyle and getting preventive care can help to protect your health and wellness after age 10. Screening and testing are the best way to find a health problem early and help you avoid having a fall. Early diagnosis and treatment give you the best chance for managing medical conditions that are more common for people who are older than age 34. Falls are a major cause of broken bones and head injuries in people who are older than age 85. Take precautions to prevent a fall at home. Work with your health care provider to learn what changes you can make to improve your health and wellness and to prevent falls. This information is not intended to replace advice given to you by your health care provider. Make sure you discuss any questions you have with your health care provider. Document Revised: 11/14/2020 Document Reviewed: 11/14/2020 Elsevier Patient Education  2024 [employer].

## 2023-08-27 NOTE — Progress Notes (Signed)
 Annual Wellness Visit  Patient: Betty Atkinson, Female    DOB: 1945/09/25, 78 y.o.   MRN: [id number]  Subjective   Betty Atkinson is a 78 y.o. female who presents today for her Annual Wellness Visit. She reports consuming a general diet. Home exercise routine includes Cubii machine. She generally feels fairly well. She reports sleeping fairly well. She does have additional problems to discuss today.   HPI Husband passed in September 2024. She is grieving and is considering getting in with a counselor with hospice. She is not sleeping well because of this, she takes Melatonin and this does help her sleep. Good support system with daughter and son in law in [place], [place].   In 2009, she has had blood clots in the right leg. She has had blood clots under her right arm pit. She also has a daughter who had a blood clot last year and was diagnosed with Factor V Leiden. She is taking Aspirin 81mg  daily. She is not on anticoagulation. She was on Elliquis for 6 months both times when she had her blood clots.   Arthritis both knees, but the right knee is the worst. She has arthritis in her hands and bursitis in her right knee.She is interested in joint injections for the arthritis.   Patient Active Problem List   Diagnosis Date Noted   Grief 08/28/2023   Chronic pain of both knees 08/28/2023   Age-related osteoporosis without current pathological fracture 06/18/2023   History of diverticulitis 06/18/2023   Hyperthyroidism 07/13/2019   Fall 07/13/2019   DVT (deep venous thrombosis) (HCC) 07/13/2019   Chronic kidney disease, stage 3 (HCC) 07/13/2019   Intractable vomiting with nausea 07/12/2019   Need for prophylactic vaccination and inoculation against influenza 05/16/2013   Osteoarthritis 05/16/2013   Cellulitis of right arm 03/06/2013   Syncope 02/16/2013   Preoperative cardiovascular examination 02/16/2013   Shortness of breath 02/16/2013   Sinus tachycardia 02/16/2013   Asthma 02/08/2013    Potassium (K) deficiency 02/08/2013   Meniere's disease 02/08/2013   Weight gain 02/08/2013   Hypothyroidism 02/08/2013   History of DVT (deep vein thrombosis) 12/20/2011   History of kidney stones 12/20/2011   Personal history of goiter 12/20/2011   DJD (degenerative joint disease) 12/20/2011   Other and unspecified hyperlipidemia 12/20/2011   GERD (gastroesophageal reflux disease) 12/20/2011   Menopause 12/20/2011   Allergic rhinitis 12/20/2011   Past Medical History:  Diagnosis Date   Allergic rhinitis    Asthma    DVT (deep venous thrombosis) (HCC) 2010   R leg, treated with coumadin x 6 months   GERD (gastroesophageal reflux disease)    Hearing disorder    R ear deafness   Hyperlipemia    Kidney calculi    recurrent, lithotripsy   Meniere's disease    Multinodular goiter    Osteoarthritis    Osteoporosis    Post-menopausal    Postural dizziness    postural syncope x 2   Renal insufficiency    Rosacea    Venous insufficiency of leg    post thrombotic syndrome   Vitamin D deficiency    Past Surgical History:  Procedure Laterality Date   CARDIAC CATHETERIZATION  2005   no CAD by cath at [facility]   LITHOTRIPSY     TONSILLECTOMY     TOTAL ABDOMINAL HYSTERECTOMY W/ BILATERAL SALPINGOOPHORECTOMY  1990   Social History   Tobacco Use   Smoking status: Never   Smokeless tobacco: Never  Substance  Use Topics   Alcohol use: No   Drug use: No   Family History  Problem Relation Age of Onset   Thyroid disease Father    Heart disease Father    Heart disease Mother    Diabetes Mother    Cancer Brother        fibrohisteofibroma   Heart disease Brother    Thyroid disease Maternal Grandmother    Allergies  Allergen Reactions   Methotrexate Derivatives Anaphylaxis    Cough, liver failure   Lyrica [Pregabalin]     Slurred speech, blurred vision, confusion, incohert   Other     PHENEGAN TABLET : MAKE PATIENT LUPPY PER PT., THUS CONFUSE   Prednisone  Swelling    Hives on face   Sulfa Antibiotics Diarrhea    indigestion      Medications: Outpatient Medications Prior to Visit  Medication Sig   albuterol (PROVENTIL HFA;VENTOLIN HFA) 108 (90 BASE) MCG/ACT inhaler Inhale 1-2 puffs into the lungs every 6 (six) hours as needed for wheezing or shortness of breath.    aspirin 81 MG chewable tablet Chew by mouth daily.   Azelaic Acid (FINACEA) 15 % cream Apply topically daily. After skin is thoroughly washed and patted dry, gently but thoroughly massage a thin film of azelaic acid cream into the affected area twice daily, in the morning and evening.   cetirizine (ZYRTEC) 10 MG tablet Take 10 mg by mouth daily.   Cholecalciferol 50 MCG (2000 UT) CAPS Take 2,000 Units by mouth daily.   famotidine (PEPCID) 20 MG tablet TAKE ONE TABLET BY MOUTH TWICE A DAY   Fluticasone Propionate, Inhal, (FLOVENT DISKUS) 100 MCG/ACT AEPB Inhale into the lungs.   Magnesium 500 MG CAPS Take 1 capsule by mouth daily.     melatonin 3 MG TABS tablet Take 3 mg by mouth at bedtime.   methimazole (TAPAZOLE) 5 MG tablet Take by mouth.   metoprolol succinate (TOPROL-XL) 50 MG 24 hr tablet Take 1 tablet (50 mg total) by mouth daily.   mometasone (NASONEX) 50 MCG/ACT nasal spray Place 2 sprays into the nose at bedtime.     montelukast (SINGULAIR) 10 MG tablet Take 10 mg by mouth at bedtime.   Omeprazole Magnesium (PRILOSEC OTC PO) Take 1 tablet by mouth daily.    potassium chloride (KLOR-CON M) 10 MEQ tablet Take 1 tablet by mouth 2 (two) times daily.   rosuvastatin (CRESTOR) 20 MG tablet Take 1 tablet (20 mg total) by mouth daily.   vitamin E 400 UNIT capsule Take 400 Units by mouth daily.     rosuvastatin (CRESTOR) 40 MG tablet Take 1/2 - 1 tab daily (Patient taking differently: Take 20 mg by mouth daily. )   [DISCONTINUED] alendronate (FOSAMAX) 70 MG tablet Take 70 mg by mouth once a week.   [DISCONTINUED] Vitamin D, Ergocalciferol, (DRISDOL) 50000 UNITS CAPS Take 50,000  Units by mouth every 7 (seven) days. Take on Sunday    No facility-administered medications prior to visit.    Allergies  Allergen Reactions   Methotrexate Derivatives Anaphylaxis    Cough, liver failure   Lyrica [Pregabalin]     Slurred speech, blurred vision, confusion, incohert   Other     PHENEGAN TABLET : MAKE PATIENT LUPPY PER PT., THUS CONFUSE   Prednisone Swelling    Hives on face   Sulfa Antibiotics Diarrhea    indigestion    Patient Care Team: Nolene Ebbs as PCP - General (Family Medicine)  Review of  Systems  Musculoskeletal:  Positive for joint pain.       Hip, knee, hand joint pain  All other systems reviewed and are negative.     Objective  BP (!) 121/55 (BP Location: Left Arm, Patient Position: Sitting, Cuff Size: Normal)   Pulse 70   Ht 5' (1.524 m)   Wt 149 lb (67.6 kg)   SpO2 95%   BMI 29.10 kg/m  BP Readings from Last 3 Encounters:  08/27/23 (!) 121/55  06/18/23 (!) 148/78  05/19/23 136/75   Wt Readings from Last 3 Encounters:  08/27/23 149 lb (67.6 kg)  06/18/23 146 lb 14.4 oz (66.6 kg)  05/11/21 145 lb (65.8 kg)      Physical Exam Constitutional:      Appearance: Normal appearance.  HENT:     Head: Normocephalic and atraumatic.     Right Ear: Tympanic membrane normal.     Left Ear: Tympanic membrane normal.     Nose: Nose normal.     Mouth/Throat:     Mouth: Mucous membranes are moist.  Eyes:     Comments: Bilateral exophthalmus  Cardiovascular:     Rate and Rhythm: Normal rate and regular rhythm.     Heart sounds: Normal heart sounds.  Pulmonary:     Effort: Pulmonary effort is normal.     Breath sounds: Normal breath sounds.  Abdominal:     General: There is no distension.     Palpations: Abdomen is soft.  Musculoskeletal:        General: Normal range of motion.     Cervical back: Normal range of motion.     Right lower leg: No edema.     Left lower leg: No edema.  Skin:    General: Skin is warm.      Capillary Refill: Capillary refill takes less than 2 seconds.  Neurological:     Mental Status: She is alert and oriented to person, place, and time.  Psychiatric:        Mood and Affect: Mood normal.    Most recent fall risk assessment:    08/27/2023   11:07 AM  Fall Risk   Falls in the past year? 0  Number falls in past yr: 0  Injury with Fall? 0  Risk for fall due to : No Fall Risks  Follow up Falls evaluation completed    Most recent depression screenings:    08/27/2023   11:07 AM 06/18/2023   11:25 AM  PHQ 2/9 Scores  PHQ - 2 Score 2 2  PHQ- 9 Score 4 4   Last CBC Lab Results  Component Value Date   WBC 4.2 08/27/2023   HGB 13.3 08/27/2023   HCT 39.8 08/27/2023   MCV 95 08/27/2023   MCH 31.8 08/27/2023   RDW 12.0 08/27/2023   PLT 178 08/27/2023   Last metabolic panel Lab Results  Component Value Date   GLUCOSE 106 (H) 08/27/2023   NA 144 08/27/2023   K 4.5 08/27/2023   CL 107 (H) 08/27/2023   CO2 22 08/27/2023   BUN 22 08/27/2023   CREATININE 1.28 (H) 08/27/2023   GFRNONAA 54 (L) 05/11/2021   CALCIUM 9.8 08/27/2023   PROT 6.7 08/27/2023   ALBUMIN 4.3 08/27/2023   LABGLOB 2.4 08/27/2023   BILITOT 0.9 08/27/2023   ALKPHOS 87 08/27/2023   AST 26 08/27/2023   ALT 15 08/27/2023   ANIONGAP 9 05/11/2021   Last lipids Lab Results  Component Value Date  CHOL 147 08/27/2023   HDL 75 08/27/2023   LDLCALC 58 08/27/2023   TRIG 70 08/27/2023   CHOLHDL 2.0 08/27/2023   Last thyroid functions Lab Results  Component Value Date   TSH 0.039 (L) 08/27/2023   T3TOTAL 432 (H) 07/10/2019      Assessment & Plan   Annual wellness visit done today including the all of the following: Reviewed patient's Family Medical History Reviewed and updated list of patient's medical providers Assessment of cognitive impairment was done Assessed patient's functional ability Established a written schedule for health screening services Health Risk Assessent Completed and  Reviewed  Immunization History  Administered Date(s) Administered   Fluad Trivalent(High Dose 65+) 07/16/2023   Influenza, High Dose Seasonal PF 04/03/2019, 03/08/2020, 06/06/2021   Influenza,inj,Quad PF,6+ Mos 03/12/2013   Moderna Sars-Covid-2 Vaccination 09/17/2019, 10/20/2019, 07/14/2020   PNEUMOCOCCAL CONJUGATE-20 08/20/2022   Pneumococcal Conjugate-13 05/02/2015   Pneumococcal Polysaccharide-23 05/10/2005, 03/07/2010   Pneumococcal-Unspecified 05/10/2005, 03/07/2010   Td 07/09/1997   Tdap 10/31/2006   Zoster, Live 11/17/2007    Health Maintenance  Topic Date Due   Hepatitis C Screening  Never done   DTaP/Tdap/Td (3 - Td or Tdap) 10/30/2016   Medicare Annual Wellness (AWV)  08/21/2023   Zoster Vaccines- Shingrix (1 of 2) 09/16/2023 (Originally 04/12/1995)   COVID-19 Vaccine (4 - 2024-25 season) 09/11/2024 (Originally 03/10/2023)   Pneumonia Vaccine 77+ Years old  Completed   INFLUENZA VACCINE  Completed   DEXA SCAN  Completed   HPV VACCINES  Aged Out   Discussed health benefits of physical activity, and encouraged her to engage in regular exercise appropriate for her age and condition.   Marland KitchenDarl Pikes was seen today for annual exam.  Diagnoses and all orders for this visit:  Routine adult health maintenance -     CBC w/Diff/Platelet -     CMP14+EGFR -     Lipid panel -     Factor 5 leiden -     TSH + free T4 -     Antiphospholipid syndrome eval, bld -     Hemoglobin A1c -     VITAMIN D 25 Hydroxy (Vit-D Deficiency, Fractures)  History of DVT (deep vein thrombosis) -     Factor 5 leiden -     Antiphospholipid syndrome eval, bld -     Coag Studies Interp Report  Bursitis of right hip, unspecified bursa  Stage 3a chronic kidney disease (HCC)  Age-related osteoporosis without current pathological fracture -     VITAMIN D 25 Hydroxy (Vit-D Deficiency, Fractures) -     DG Bone Density; Future  Screening for diabetes mellitus -     CMP14+EGFR  Screening for lipid  disorders -     Lipid panel  Visit for screening mammogram -     MM 3D SCREENING MAMMOGRAM BILATERAL BREAST; Future  Grief  Chronic pain of both knees  Trouble in sleeping  Hyperthyroidism   Discussed 150 minutes of exercise a week.  Encouraged vitamin D 1000 units and Calcium 1300mg  or 4 servings of dairy a day.  Fasting labs ordered today  PHQ not to goal, ongoing grief after death of husband She is going to go through grief counseling with hospice Bone density and Mammogram ordered Pt has osteoporosis discussed considering treatment, she has always declined in past Make sure taking vitamin D and calcium daily Colonoscopy UTD Hx of DVT-anticoagulation panel ordered with factor 5 leiden Shingles and tdap to be updated at pharmacy Encouraged to have medicare wellness  Chronic knee pain-make appt with Dr. Karie Schwalbe. Given voltaren gel to use daily as needed over joints.   Upcoming appt with Endocrinology regarding thyroid disease         Tandy Gaw, PA-C

## 2023-08-28 ENCOUNTER — Encounter: Payer: Self-pay | Admitting: Physician Assistant

## 2023-08-28 DIAGNOSIS — E0590 Thyrotoxicosis, unspecified without thyrotoxic crisis or storm: Secondary | ICD-10-CM | POA: Insufficient documentation

## 2023-08-28 DIAGNOSIS — F4321 Adjustment disorder with depressed mood: Secondary | ICD-10-CM | POA: Insufficient documentation

## 2023-08-28 DIAGNOSIS — G8929 Other chronic pain: Secondary | ICD-10-CM | POA: Insufficient documentation

## 2023-08-28 NOTE — Progress Notes (Signed)
 Scott,   Hemoglobin and WBC look great.  Vitamin D looks wonderful.  A1C is normal but close to pre-diabetes, will continue to watch Continue to limit sugar/carbs in diet Cholesterol looks GREAT!  T4 is normal range which is great. TSH low. Subclinical hyperthyroidism Kidney function decreased a little bit from baseline. Avoid regular use of oral anti-inflammatories, stay hydrated.  We could consider a medication that helps protect kidneys from further declined. Are you interested?   Clotting labs pending.

## 2023-09-03 LAB — LIPID PANEL
Chol/HDL Ratio: 2 ratio (ref 0.0–4.4)
Cholesterol, Total: 147 mg/dL (ref 100–199)
HDL: 75 mg/dL (ref >39)
LDL Chol Calc (NIH): 58 mg/dL (ref 0–99)
Triglycerides: 70 mg/dL (ref 0–149)
VLDL Cholesterol Cal: 14 mg/dL (ref 5–40)

## 2023-09-03 LAB — CBC WITH DIFFERENTIAL/PLATELET
Basophils Absolute: 0 10*3/uL (ref 0.0–0.2)
Basos: 1 %
EOS (ABSOLUTE): 0.1 10*3/uL (ref 0.0–0.4)
Eos: 2 %
Hematocrit: 39.8 % (ref 34.0–46.6)
Hemoglobin: 13.3 g/dL (ref 11.1–15.9)
Immature Grans (Abs): 0 10*3/uL (ref 0.0–0.1)
Immature Granulocytes: 0 %
Lymphocytes Absolute: 1.9 10*3/uL (ref 0.7–3.1)
Lymphs: 44 %
MCH: 31.8 pg (ref 26.6–33.0)
MCHC: 33.4 g/dL (ref 31.5–35.7)
MCV: 95 fL (ref 79–97)
Monocytes Absolute: 0.4 10*3/uL (ref 0.1–0.9)
Monocytes: 9 %
Neutrophils Absolute: 1.9 10*3/uL (ref 1.4–7.0)
Neutrophils: 44 %
Platelets: 178 10*3/uL (ref 150–450)
RBC: 4.18 x10E6/uL (ref 3.77–5.28)
RDW: 12 % (ref 11.7–15.4)
WBC: 4.2 10*3/uL (ref 3.4–10.8)

## 2023-09-03 LAB — CMP14+EGFR
ALT: 15 IU/L (ref 0–32)
AST: 26 IU/L (ref 0–40)
Albumin: 4.3 g/dL (ref 3.8–4.8)
Alkaline Phosphatase: 87 IU/L (ref 44–121)
BUN/Creatinine Ratio: 17 (ref 12–28)
BUN: 22 mg/dL (ref 8–27)
Bilirubin Total: 0.9 mg/dL (ref 0.0–1.2)
CO2: 22 mmol/L (ref 20–29)
Calcium: 9.8 mg/dL (ref 8.7–10.3)
Chloride: 107 mmol/L — ABNORMAL HIGH (ref 96–106)
Creatinine, Ser: 1.28 mg/dL — ABNORMAL HIGH (ref 0.57–1.00)
Globulin, Total: 2.4 g/dL (ref 1.5–4.5)
Glucose: 106 mg/dL — ABNORMAL HIGH (ref 70–99)
Potassium: 4.5 mmol/L (ref 3.5–5.2)
Sodium: 144 mmol/L (ref 134–144)
Total Protein: 6.7 g/dL (ref 6.0–8.5)
eGFR: 43 mL/min/{1.73_m2} — ABNORMAL LOW (ref >59)

## 2023-09-03 LAB — HEMOGLOBIN A1C
Est. average glucose Bld gHb Est-mCnc: 114 mg/dL
Hgb A1c MFr Bld: 5.6 % (ref 4.8–5.6)

## 2023-09-03 LAB — ANTIPHOSPHOLIPID SYNDROME EVAL, BLD
APTT PPP: 23.8 s (ref 22.9–30.2)
Anticardiolipin IgG: <9 GPL U/mL (ref 0–14)
Anticardiolipin IgM: 17 [MPL'U]/mL — ABNORMAL HIGH (ref 0–12)
Beta-2 Glyco 1 IgM: <9 GPI IgM units (ref 0–32)
Beta-2 Glyco I IgG: <9 GPI IgG units (ref 0–20)
Dilute Viper Venom Time: 28.8 s (ref 0.0–47.0)
Hexagonal Phase Phospholipid: 3 s (ref 0–11)
INR: 1 (ref 0.9–1.2)
PT: 11 s (ref 9.1–12.0)
Thrombin Time: 18.3 s (ref 0.0–23.0)

## 2023-09-03 LAB — TSH+FREE T4
Free T4: 0.95 ng/dL (ref 0.82–1.77)
TSH: 0.039 u[IU]/mL — ABNORMAL LOW (ref 0.450–4.500)

## 2023-09-03 LAB — VITAMIN D 25 HYDROXY (VIT D DEFICIENCY, FRACTURES): Vit D, 25-Hydroxy: 61.2 ng/mL (ref 30.0–100.0)

## 2023-09-03 LAB — COAG STUDIES INTERP REPORT

## 2023-09-03 LAB — FACTOR 5 LEIDEN

## 2023-09-03 NOTE — Progress Notes (Signed)
 Factor 5 Leiden not detected!  Very weak positive anticardiolipin which could increase blood clotting risk just a little but once again a very weak positive.

## 2023-09-04 ENCOUNTER — Ambulatory Visit: Payer: Medicare Other

## 2023-09-04 ENCOUNTER — Encounter: Payer: Self-pay | Admitting: Sports Medicine

## 2023-09-04 ENCOUNTER — Other Ambulatory Visit (INDEPENDENT_AMBULATORY_CARE_PROVIDER_SITE_OTHER): Payer: Self-pay

## 2023-09-04 ENCOUNTER — Ambulatory Visit (INDEPENDENT_AMBULATORY_CARE_PROVIDER_SITE_OTHER): Payer: Medicare Other | Admitting: Sports Medicine

## 2023-09-04 DIAGNOSIS — M1711 Unilateral primary osteoarthritis, right knee: Secondary | ICD-10-CM | POA: Insufficient documentation

## 2023-09-04 DIAGNOSIS — M47816 Spondylosis without myelopathy or radiculopathy, lumbar region: Secondary | ICD-10-CM | POA: Diagnosis not present

## 2023-09-04 DIAGNOSIS — M25551 Pain in right hip: Secondary | ICD-10-CM | POA: Diagnosis not present

## 2023-09-04 DIAGNOSIS — M7061 Trochanteric bursitis, right hip: Secondary | ICD-10-CM

## 2023-09-04 DIAGNOSIS — M170 Bilateral primary osteoarthritis of knee: Secondary | ICD-10-CM | POA: Diagnosis not present

## 2023-09-04 DIAGNOSIS — G8929 Other chronic pain: Secondary | ICD-10-CM | POA: Diagnosis not present

## 2023-09-04 DIAGNOSIS — M1710 Unilateral primary osteoarthritis, unspecified knee: Secondary | ICD-10-CM | POA: Diagnosis not present

## 2023-09-04 MED ORDER — TRIAMCINOLONE ACETONIDE 40 MG/ML IJ SUSP
80.0000 mg | Freq: Once | INTRAMUSCULAR | Status: AC
Start: 2023-09-04 — End: 2023-09-04
  Administered 2023-09-04: 80 mg via INTRAMUSCULAR

## 2023-09-04 NOTE — Assessment & Plan Note (Signed)
 This is a very pleasant 78 year old female, she has known knee osteoarthritis, she gets occasional injections, today we will do a right knee injection, I would like x-rays and formal PT. Return to see me in 6 weeks.

## 2023-09-04 NOTE — Assessment & Plan Note (Signed)
 Also with chronic right hip pain, laterally, she does well with trochanteric bursa injections intermittently. Today she has pain directly over the greater trochanter, weak hip abductor's. We did an injection today per her request, I would like x-rays, she does need aggressive formal therapy for hip abductor strengthening . Typically when the hip abductors are strong the trochanteric bursitis/hip abductor tendinopathy resolves.

## 2023-09-04 NOTE — Addendum Note (Signed)
 Addended by: Samule Dry on: 09/04/2023 12:03 PM   Modules accepted: Orders

## 2023-09-04 NOTE — Progress Notes (Signed)
    Procedures performed today:    Procedure: Real-time Ultrasound Guided injection of the right greater trochanteric bursa Device: Samsung HS60  Verbal informed consent obtained.  Time-out conducted.  Noted no overlying erythema, induration, or other signs of local infection.  Skin prepped in a sterile fashion.  Local anesthesia: Topical Ethyl chloride.  With sterile technique and under real time ultrasound guidance: Noted diminutive hip abductors, 1 cc Kenalog 40, 2 cc lidocaine, 2 cc bupivacaine injected easily Completed without difficulty  Advised to call if fevers/chills, erythema, induration, drainage, or persistent bleeding.  Images permanently stored and available for review in PACS.  Impression: Technically successful ultrasound guided injection.  Procedure: Real-time Ultrasound Guided injection of the right knee Device: Samsung HS60  Verbal informed consent obtained.  Time-out conducted.  Noted no overlying erythema, induration, or other signs of local infection.  Skin prepped in a sterile fashion.  Local anesthesia: Topical Ethyl chloride.  With sterile technique and under real time ultrasound guidance: Mild effusion noted, 1 cc Kenalog 40, 2 cc lidocaine, 2 cc bupivacaine injected easily Completed without difficulty  Advised to call if fevers/chills, erythema, induration, drainage, or persistent bleeding.  Images permanently stored and available for review in PACS.  Impression: Technically successful ultrasound guided injection.  Independent interpretation of notes and tests performed by another provider:   None.  Brief History, Exam, Impression, and Recommendations:    Primary osteoarthritis of right knee This is a very pleasant 78 year old female, she has known knee osteoarthritis, she gets occasional injections, today we will do a right knee injection, I would like x-rays and formal PT. Return to see me in 6 weeks.  Greater trochanteric bursitis of right  hip Also with chronic right hip pain, laterally, she does well with trochanteric bursa injections intermittently. Today she has pain directly over the greater trochanter, weak hip abductor's. We did an injection today per her request, I would like x-rays, she does need aggressive formal therapy for hip abductor strengthening . Typically when the hip abductors are strong the trochanteric bursitis/hip abductor tendinopathy resolves.    ____________________________________________ Ihor Austin. Benjamin Stain, M.D., ABFM., CAQSM., AME. Primary Care and Sports Medicine [facility]  Adjunct Professor of Family Medicine  [employer]  Restaurant manager, fast food

## 2023-09-05 ENCOUNTER — Other Ambulatory Visit: Payer: Self-pay | Admitting: Physician Assistant

## 2023-09-05 ENCOUNTER — Telehealth: Payer: Self-pay

## 2023-09-05 NOTE — Telephone Encounter (Signed)
 Spoke wit patient regarding questions and lab work results.  She states she sees a nephrologist -Dr. Janit Pagan And he has not mentioned about a medication to protect her kidneys.  She last saw him Dec 9th and will see him again in June.  Should we forward lab results to Dr. Janit Pagan and have him address?

## 2023-09-05 NOTE — Telephone Encounter (Signed)
 Copied from CRM [id number]. Topic: Clinical - Lab/Test Results >> Sep 05, 2023 11:35 AM Fuller Mandril wrote: Reason for CRM: Patient called. Has not received test results. Does not have access to MyChart. Read notes as written by provider. Patient wanted more information on medication to help with kidney function before agreeing. Patient also requesting a copy of results for her records since she does not use MyChart. Thank You

## 2023-09-06 NOTE — Telephone Encounter (Signed)
 Patient informed . Lab results faxed to DR. Nwobu.

## 2023-09-10 ENCOUNTER — Ambulatory Visit: Payer: Medicare Other | Admitting: Rehabilitative and Restorative Service Providers"

## 2023-09-11 ENCOUNTER — Other Ambulatory Visit: Payer: Self-pay

## 2023-09-11 MED ORDER — MONTELUKAST SODIUM 10 MG PO TABS: 10.0000 mg | ORAL_TABLET | Freq: Every day | ORAL | 3 refills | Status: AC

## 2023-09-11 NOTE — Telephone Encounter (Signed)
 Patient requesting Montelukast 10mg   Last written by historical provider Last OV 08/27/2023 Upcoming appt 10/14/2023 AWV

## 2023-09-15 NOTE — Therapy (Unsigned)
 OUTPATIENT PHYSICAL THERAPY LOWER EXTREMITY EVALUATION   Patient Name: Betty Atkinson MRN: [id number] DOB:1946/02/01, 78 y.o., female Today's Date: 09/15/2023  END OF SESSION:   Past Medical History:  Diagnosis Date   Allergic rhinitis    Asthma    DVT (deep venous thrombosis) (HCC) 2010   R leg, treated with coumadin x 6 months   GERD (gastroesophageal reflux disease)    Hearing disorder    R ear deafness   Hyperlipemia    Kidney calculi    recurrent, lithotripsy   Meniere's disease    Multinodular goiter    Osteoarthritis    Osteoporosis    Post-menopausal    Postural dizziness    postural syncope x 2   Renal insufficiency    Rosacea    Venous insufficiency of leg    post thrombotic syndrome   Vitamin D deficiency    Past Surgical History:  Procedure Laterality Date   CARDIAC CATHETERIZATION  2005   no CAD by cath at [facility]   LITHOTRIPSY     TONSILLECTOMY     TOTAL ABDOMINAL HYSTERECTOMY W/ BILATERAL SALPINGOOPHORECTOMY  1990   Patient Active Problem List   Diagnosis Date Noted   Greater trochanteric bursitis of right hip 09/04/2023   Primary osteoarthritis of right knee 09/04/2023   Grief 08/28/2023   Subclinical hyperthyroidism 08/28/2023   Age-related osteoporosis without current pathological fracture 06/18/2023   History of diverticulitis 06/18/2023   Hyperthyroidism 07/13/2019   Fall 07/13/2019   DVT (deep venous thrombosis) (HCC) 07/13/2019   Chronic kidney disease, stage 3 (HCC) 07/13/2019   Intractable vomiting with nausea 07/12/2019   Need for prophylactic vaccination and inoculation against influenza 05/16/2013   Osteoarthritis 05/16/2013   Cellulitis of right arm 03/06/2013   Syncope 02/16/2013   Preoperative cardiovascular examination 02/16/2013   Shortness of breath 02/16/2013   Sinus tachycardia 02/16/2013   Asthma 02/08/2013   Potassium (K) deficiency 02/08/2013   Meniere's disease 02/08/2013   Weight gain 02/08/2013    Hypothyroidism 02/08/2013   History of DVT (deep vein thrombosis) 12/20/2011   History of kidney stones 12/20/2011   Personal history of goiter 12/20/2011   DJD (degenerative joint disease) 12/20/2011   Other and unspecified hyperlipidemia 12/20/2011   GERD (gastroesophageal reflux disease) 12/20/2011   Menopause 12/20/2011   Allergic rhinitis 12/20/2011    PCP: Tandy Gaw, PA-C  REFERRING PROVIDER: dr Rodney Langton  REFERRING DIAG: Primary OA R knee   THERAPY DIAG:  No diagnosis found.  Rationale for Evaluation and Treatment: Rehabilitation  ONSET DATE: ***  SUBJECTIVE:   SUBJECTIVE STATEMENT: ***  PERTINENT HISTORY: *** PAIN:  Are you having pain? Yes: NPRS scale: *** Pain location: *** Pain description: *** Aggravating factors: *** Relieving factors: ***  PRECAUTIONS: {Therapy precautions:24002}  RED FLAGS: None   WEIGHT BEARING RESTRICTIONS: No  FALLS:  Has patient fallen in last 6 months? {fallsyesno:27318}  LIVING ENVIRONMENT: Lives with: lives with their spouse Lives in: House/apartment Stairs: {opstairs:27293} Has following equipment at home: {Assistive devices:23999}  OCCUPATION: ***  PLOF: Independent  PATIENT GOALS: ***  NEXT MD VISIT: 10/16/23  OBJECTIVE:  Note: Objective measures were completed at Evaluation unless otherwise noted.  DIAGNOSTIC FINDINGS: xrays R/L knees; hips/pelvis 09/04/23 - results not available    PATIENT SURVEYS:  LEFS ***  COGNITION: Overall cognitive status: Within functional limits for tasks assessed     SENSATION: {sensation:27233}  EDEMA:  {edema:24020}  MUSCLE LENGTH: Hamstrings: Right *** deg; Left *** deg Maisie Fus test: Right ***  deg; Left *** deg  POSTURE: {posture:25561}  PALPATION: ***  LOWER EXTREMITY ROM:  Active ROM Right eval Left eval  Hip flexion    Hip extension    Hip abduction    Hip adduction    Hip internal rotation    Hip external rotation    Knee flexion     Knee extension    Ankle dorsiflexion    Ankle plantarflexion    Ankle inversion    Ankle eversion     (Blank rows = not tested)  LOWER EXTREMITY MMT:  MMT Right eval Left eval  Hip flexion    Hip extension    Hip abduction    Hip adduction    Hip internal rotation    Hip external rotation    Knee flexion    Knee extension    Ankle dorsiflexion    Ankle plantarflexion    Ankle inversion    Ankle eversion     (Blank rows = not tested)  LOWER EXTREMITY SPECIAL TESTS:  {LEspecialtests:26242}  FUNCTIONAL TESTS:  5 times sit to stand: ***  GAIT: Distance walked: 40 ft Assistive device utilized: {Assistive devices:23999} Level of assistance: {Levels of assistance:24026} Comments: ***                                                                                                                                TREATMENT DATE: ***    PATIENT EDUCATION:  Education details: POC; HEP  Person educated: Patient Education method: Programmer, multimedia, Facilities manager, Actor cues, Verbal cues, and Handouts Education comprehension: verbalized understanding, returned demonstration, verbal cues required, tactile cues required, and needs further education  HOME EXERCISE PROGRAM: ***  ASSESSMENT:  CLINICAL IMPRESSION: Patient is a 78 y.o. female who was seen today for physical therapy evaluation and treatment for hip and knee pain.   OBJECTIVE IMPAIRMENTS: {opptimpairments:25111}.   ACTIVITY LIMITATIONS: {activitylimitations:27494}  PARTICIPATION LIMITATIONS: {participationrestrictions:25113}  PERSONAL FACTORS: {Personal factors:25162} are also affecting patient's functional outcome.   REHAB POTENTIAL: Good  CLINICAL DECISION MAKING: Evolving/moderate complexity  EVALUATION COMPLEXITY: Moderate   GOALS: Goals reviewed with patient? Yes  SHORT TERM GOALS: Target date: *** Independent in initial HEP  Baseline: Goal status: INITIAL  2.  *** Baseline:  Goal status:  INITIAL   LONG TERM GOALS: Target date: ***  *** Baseline:  Goal status: INITIAL  2.  *** Baseline:  Goal status: INITIAL  3.  *** Baseline:  Goal status: INITIAL  4.  *** Baseline:  Goal status: INITIAL  5.  *** Baseline:  Goal status: INITIAL  6.  *** Baseline:  Goal status: INITIAL   PLAN:  PT FREQUENCY: 2x/week  PT DURATION: 8 weeks  PLANNED INTERVENTIONS: 97110-Therapeutic exercises, 97530- Therapeutic activity, [id number]- Neuromuscular re-education, 97535- Self Care, 09811- Manual therapy, [id number]- Gait training, [id number]- Aquatic Therapy, [id number]- Ultrasound, 91478- Ionotophoresis 4mg /ml Dexamethasone, Patient/Family education, Balance training, Stair training, Taping, Dry Needling, Joint mobilization, Cryotherapy, and Moist heat  PLAN FOR  NEXT SESSION: review and progress exercise; continue with body mechanics and ergonomic education; manual work and modalities as indicated    [employer], PT 09/15/2023, 1:42 PM

## 2023-09-17 ENCOUNTER — Encounter: Payer: Self-pay | Admitting: Rehabilitative and Restorative Service Providers"

## 2023-09-17 ENCOUNTER — Ambulatory Visit: Payer: Medicare Other | Attending: Sports Medicine | Admitting: Rehabilitative and Restorative Service Providers"

## 2023-09-17 ENCOUNTER — Other Ambulatory Visit: Payer: Self-pay

## 2023-09-17 DIAGNOSIS — G8929 Other chronic pain: Secondary | ICD-10-CM | POA: Insufficient documentation

## 2023-09-17 DIAGNOSIS — M25561 Pain in right knee: Secondary | ICD-10-CM | POA: Insufficient documentation

## 2023-09-17 DIAGNOSIS — M1711 Unilateral primary osteoarthritis, right knee: Secondary | ICD-10-CM | POA: Diagnosis not present

## 2023-09-17 DIAGNOSIS — M6281 Muscle weakness (generalized): Secondary | ICD-10-CM | POA: Diagnosis not present

## 2023-09-17 DIAGNOSIS — R29898 Other symptoms and signs involving the musculoskeletal system: Secondary | ICD-10-CM

## 2023-09-17 DIAGNOSIS — M25551 Pain in right hip: Secondary | ICD-10-CM | POA: Insufficient documentation

## 2023-09-19 ENCOUNTER — Ambulatory Visit

## 2023-09-19 DIAGNOSIS — M6281 Muscle weakness (generalized): Secondary | ICD-10-CM | POA: Diagnosis not present

## 2023-09-19 DIAGNOSIS — M1711 Unilateral primary osteoarthritis, right knee: Secondary | ICD-10-CM | POA: Diagnosis not present

## 2023-09-19 DIAGNOSIS — M25551 Pain in right hip: Secondary | ICD-10-CM | POA: Diagnosis not present

## 2023-09-19 DIAGNOSIS — M25561 Pain in right knee: Secondary | ICD-10-CM | POA: Diagnosis not present

## 2023-09-19 DIAGNOSIS — G8929 Other chronic pain: Secondary | ICD-10-CM

## 2023-09-19 DIAGNOSIS — R29898 Other symptoms and signs involving the musculoskeletal system: Secondary | ICD-10-CM

## 2023-09-19 NOTE — Therapy (Signed)
 OUTPATIENT PHYSICAL THERAPY LOWER EXTREMITY TREATMENT   Patient Name: Betty Atkinson MRN: [id number] DOB:Jan 30, 1946, 78 y.o., female Today's Date: 09/19/2023  END OF SESSION:  PT End of Session - 09/19/23 1224     Visit Number 2    Number of Visits 16    Date for PT Re-Evaluation 11/12/23    Authorization Type [employer] $10 copay    Authorization Time Period no auth required    Progress Note Due on Visit 10    PT Start Time 1145    PT Stop Time 1223    PT Time Calculation (min) 38 min    Activity Tolerance Patient tolerated treatment well              Past Medical History:  Diagnosis Date   Allergic rhinitis    Asthma    DVT (deep venous thrombosis) (HCC) 2010   R leg, treated with coumadin x 6 months   GERD (gastroesophageal reflux disease)    Hearing disorder    R ear deafness   Hyperlipemia    Kidney calculi    recurrent, lithotripsy   Meniere's disease    Multinodular goiter    Osteoarthritis    Osteoporosis    Post-menopausal    Postural dizziness    postural syncope x 2   Renal insufficiency    Rosacea    Venous insufficiency of leg    post thrombotic syndrome   Vitamin D deficiency    Past Surgical History:  Procedure Laterality Date   CARDIAC CATHETERIZATION  2005   no CAD [facility] cath at [facility]   LITHOTRIPSY     TONSILLECTOMY     TOTAL ABDOMINAL HYSTERECTOMY W/ BILATERAL SALPINGOOPHORECTOMY  1990   Patient Active Problem List   Diagnosis Date Noted   Greater trochanteric bursitis of right hip 09/04/2023   Primary osteoarthritis of right knee 09/04/2023   Grief 08/28/2023   Subclinical hyperthyroidism 08/28/2023   Age-related osteoporosis without current pathological fracture 06/18/2023   History of diverticulitis 06/18/2023   Hyperthyroidism 07/13/2019   Fall 07/13/2019   DVT (deep venous thrombosis) (HCC) 07/13/2019   Chronic kidney disease, stage 3 (HCC) 07/13/2019   Intractable vomiting with nausea 07/12/2019   Need for  prophylactic vaccination and inoculation against influenza 05/16/2013   Osteoarthritis 05/16/2013   Cellulitis of right arm 03/06/2013   Syncope 02/16/2013   Preoperative cardiovascular examination 02/16/2013   Shortness of breath 02/16/2013   Sinus tachycardia 02/16/2013   Asthma 02/08/2013   Potassium (K) deficiency 02/08/2013   Meniere's disease 02/08/2013   Weight gain 02/08/2013   Hypothyroidism 02/08/2013   History of DVT (deep vein thrombosis) 12/20/2011   History of kidney stones 12/20/2011   Personal history of goiter 12/20/2011   DJD (degenerative joint disease) 12/20/2011   Other and unspecified hyperlipidemia 12/20/2011   GERD (gastroesophageal reflux disease) 12/20/2011   Menopause 12/20/2011   Allergic rhinitis 12/20/2011    PCP: Tandy Gaw, PA-C  REFERRING PROVIDER: dr Rodney Langton  REFERRING DIAG: Primary OA R knee   THERAPY DIAG:  Chronic pain of right knee  Pain in right hip  Other symptoms and signs involving the musculoskeletal system  Muscle weakness (generalized)  Rationale for Evaluation and Treatment: Rehabilitation  ONSET DATE: 08/10/23  SUBJECTIVE:   SUBJECTIVE STATEMENT: 09/19/2023 Patient returned to the clinic and reviewed her chief complaints of R hip and R knee pain. She did not note any adverse response to the initial session and reported even feeling a little  bit better overall.  From evaluation: Patient reports that she has had arthritis in the R knee for the past 40-50 years. She has received injections for years with most recent 09/04/23 of R hip and knee with improvement. She continues to have intermittent pain in R knee. She has had company over the past several days and has just "over done it".   PERTINENT HISTORY: Chronic kidney disease; arthritis; Meniere's disease with surgery ~ 8 years ago; hx of LBP with injections x 2 in past; blood clots in R LE 2010  PAIN:  Are you having pain? Yes: NPRS scale: 2-3/10 Pain  location: R knee  Pain description: throbbing; cramps in both calves  Aggravating factors: activity  Relieving factors: injections; topical analgesics   Are you having pain? Yes: NPRS scale: 0/10 Pain location: R hip  Pain description: N/A Aggravating factors: lying on R side  Relieving factors: injection   PRECAUTIONS: None  RED FLAGS: None   WEIGHT BEARING RESTRICTIONS: No  FALLS:  Has patient fallen in last 6 months? Yes. 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  DIAGNOSTIC FINDINGS: xrays R/L knees; hips/pelvis 09/04/23 - results not available    PATIENT SURVEYS:  LEFS 27/80; 33.8%  COGNITION: Overall cognitive status: Within functional limits for tasks assessed     SENSATION: WFL  EDEMA:  Some edema R knee on an intermittent basis   MUSCLE LENGTH: Hamstrings: Right 45 deg; Left 55 deg Thomas test: tight Right  > Left  POSTURE: rounded shoulders, forward head, decreased lumbar lordosis, increased thoracic kyphosis, flexed trunk , and weight shift left  PALPATION: Tender peripatellar area; R posterior hip through piriformis and gluts   LOWER EXTREMITY ROM:  Active ROM Right eval Left eval  Hip flexion Tight end range  WNL's  Hip extension ~ 5 deg  ~ 10 deg   Hip abduction    Hip adduction    Hip internal  rotation Tight    Hip external rotation Tight    Knee flexion ~ 80 deg prone ~ 85 deg prone   Knee extension WFL's  WFL's   Ankle dorsiflexion    Ankle plantarflexion    Ankle inversion    Ankle eversion     (Blank rows = not tested)  LOWER EXTREMITY MMT:  MMT Right eval Left eval  Hip flexion 4 4+  Hip extension 3+ 4-  Hip abduction 3+ 4  Hip adduction    Hip internal rotation    Hip external rotation    Knee flexion 4+ 5-  Knee extension 4 4+  Ankle dorsiflexion    Ankle plantarflexion    Ankle inversion    Ankle eversion     (Blank rows = not tested)   FUNCTIONAL TESTS:  5 times sit to stand: 19.92 sec - some use of hands on table or hands on  thighs - (typically moves slowly due to balance problems) SLS - R 6 sec; L 3 sec   GAIT: Distance walked: 40 ft Assistive device utilized: None Level of assistance: Complete Independence Comments: WFL's - flexed forward posture   [facility] Adult PT Treatment:                                                DATE: 09/19/2023 Therapeutic Exercise: Supine: Manual stretching R calf Manual stretching R tibialis posterior Manual stretching R posterolateral hip musculature Manual Therapy: Hooklying: R knee: AP and AP/lateral tibial glides working into greater amounts of R knee flexion R knee: inferior patellar glides working into greater amounts of R knee flexion R knee: soft tissue mobilization to R calf and R posterior tibialis R hip: long axis posterior lateral glides working into greater amounts of R hip flexion R hip: long axis distraction working into greater amounts of R hip flexion R hip: soft tissue mobilization to posterolateral hip musculaure                                                                                                                               [facility] Adult PT Treatment:                                                DATE: 09/17/23 Therapeutic Exercise: Prone (did not assign prone exercises for home  due to hx of back pain - some irritation today post ex) Glut set 5 sec x 5  Quad stretch with strap 20 sec x 2 R/L Quad set toe on table 5 sec x 5 R/L Supine Hamstring stretch hooklying with strap 30 sec x 2 R/L Quad set 5 sec x 5 R -  towel under knee  SLR small range 5 sec x 5 R Standing SLS 5-10 sec with UE support as needed for balance  Self Care: Avoid sitting with ankles or knees crossed     PATIENT EDUCATION:  Education details: POC; HEP  Person educated: Patient Education method: Explanation, Demonstration, Tactile cues, Verbal cues, and Handouts Education comprehension: verbalized understanding, returned demonstration, verbal cues required, tactile cues required, and needs further education  HOME EXERCISE PROGRAM: Access Code: [email or web address] URL: [email or web address] Date: 09/17/2023 Prepared by: Corlis Leak  Exercises - Hooklying Hamstring Stretch with Strap  - 2 x daily - 7 x weekly - 1 sets - 3 reps - 30 sec  hold - Supine Quad Set  - 2 x daily - 7 x weekly - 1 sets - 10 reps - 3 sec  hold - Small Range Straight Leg Raise  - 2 x daily - 7 x weekly - 1 sets -  10 reps - 5 sec  hold - Standing Single Leg Stance with Counter Support  - 2 x daily - 7 x weekly - 1 sets - 3-5 reps - 5-10 sec  hold  ASSESSMENT:  CLINICAL IMPRESSION: 09/19/2023 Focused on improving R knee and R hip mobility into flexion today. Both joints improved with treatment - still noting pain at end of range but greater pain-free ROM was achieved. No adverse response noted when ambulating out of the clinic. Physical therapy remains indicated to continue addressing problems identified.  From evaluation: Patient is a 78 y.o. female who was seen today for physical therapy evaluation and treatment for R hip and knee pain. She has a long standing history of R hip and knee pain and receives injections on a regular basis with good temporary pain management. Patient has limited ROM, mobility, strength in  R > L LE; decreased balance; pain limiting functional activities. She has muscular tightness to palpation through the R piriformis and gluts. Patient will benefit from PT to address problems identified.   OBJECTIVE IMPAIRMENTS: decreased activity tolerance, decreased balance, decreased mobility, decreased ROM, decreased strength, increased edema, increased fascial restrictions, impaired flexibility, postural dysfunction, and pain.   ACTIVITY LIMITATIONS: carrying, lifting, bending, sitting, standing, squatting, stairs, transfers, bed mobility, and locomotion level  PARTICIPATION LIMITATIONS: meal prep, cleaning, and laundry  PERSONAL FACTORS: Age, Fitness, Past/current experiences, Time since onset of injury/illness/exacerbation, and -comorbidities: osteopenia; Meniere's disease  are also affecting patient's functional outcome.   REHAB POTENTIAL: Good  CLINICAL DECISION MAKING: Evolving/moderate complexity  EVALUATION COMPLEXITY: Moderate   GOALS: Goals reviewed with patient? Yes  SHORT TERM GOALS: Target date: 10/15/2023   Independent in initial HEP  Baseline: Goal status: INITIAL  2.  Patient demonstrates and reports greater ease with all transfers and bed mobility  Baseline:  Goal status: INITIAL   LONG TERM GOALS: Target date: 11/12/2023   Decrease R hip and knee pain by 50-75%  Baseline:  Goal status: INITIAL  2.  Increase strength R LE to 4+/5 to 5/5  Baseline:  Goal status: INITIAL  3.  Decrease 5 times sit to stand time by 2-3 seconds  Baseline: 19.92 sec  Goal status: INITIAL  4.  Patient demonstrates increased SLS time to 10 sec R/L with minimal to no UE support  Baseline:  Goal status: INITIAL  5.  Independent in HEP including aquatic program as indicated  Baseline:  Goal status: INITIAL  6.  Improve LEFs score by 10-20 points  Baseline: LEFS 27/80; 33.8% Goal status: INITIAL   PLAN:  PT FREQUENCY: 2x/week  PT DURATION: 8 weeks  PLANNED  INTERVENTIONS: 97110-Therapeutic exercises, 97530- Therapeutic activity, 97112- Neuromuscular re-education, 97535- Self Care, 82956- Manual therapy, [phone]- Gait training, [phone]- Aquatic Therapy, [phone]- Ultrasound, [phone]- Ionotophoresis 4mg /ml Dexamethasone, Patient/Family education, Balance training, Stair training, Taping, Dry Needling, Joint mobilization, Cryotherapy, and Moist heat  PLAN FOR NEXT SESSION: review and progress exercise; continue with body mechanics and ergonomic education; manual work and modalities as indicated    Clelia Schaumann, PT 09/19/2023, 12:31 PM

## 2023-09-24 ENCOUNTER — Ambulatory Visit: Admitting: Rehabilitative and Restorative Service Providers"

## 2023-09-24 ENCOUNTER — Encounter: Payer: Self-pay | Admitting: Rehabilitative and Restorative Service Providers"

## 2023-09-24 DIAGNOSIS — G8929 Other chronic pain: Secondary | ICD-10-CM | POA: Diagnosis not present

## 2023-09-24 DIAGNOSIS — M25551 Pain in right hip: Secondary | ICD-10-CM

## 2023-09-24 DIAGNOSIS — M25561 Pain in right knee: Secondary | ICD-10-CM | POA: Diagnosis not present

## 2023-09-24 DIAGNOSIS — R29898 Other symptoms and signs involving the musculoskeletal system: Secondary | ICD-10-CM

## 2023-09-24 DIAGNOSIS — M6281 Muscle weakness (generalized): Secondary | ICD-10-CM | POA: Diagnosis not present

## 2023-09-24 DIAGNOSIS — M1711 Unilateral primary osteoarthritis, right knee: Secondary | ICD-10-CM | POA: Diagnosis not present

## 2023-09-24 NOTE — Therapy (Signed)
 OUTPATIENT PHYSICAL THERAPY LOWER EXTREMITY TREATMENT   Patient Name: Betty Atkinson MRN: [id number] DOB:[birth year], 78 y.o., female Today's Date: 09/24/2023  END OF SESSION:  PT End of Session - 09/24/23 1143     Visit Number 3    Number of Visits 16    Date for PT Re-Evaluation 11/12/23    Authorization Type [employer] $10 copay    Authorization Time Period no auth required    Progress Note Due on Visit 10    PT Start Time 1143    PT Stop Time 1228    PT Time Calculation (min) 45 min    Activity Tolerance Patient tolerated treatment well              Past Medical History:  Diagnosis Date   Allergic rhinitis    Asthma    DVT (deep venous thrombosis) (HCC) 2010   R leg, treated with coumadin x 6 months   GERD (gastroesophageal reflux disease)    Hearing disorder    R ear deafness   Hyperlipemia    Kidney calculi    recurrent, lithotripsy   Meniere's disease    Multinodular goiter    Osteoarthritis    Osteoporosis    Post-menopausal    Postural dizziness    postural syncope x 2   Renal insufficiency    Rosacea    Venous insufficiency of leg    post thrombotic syndrome   Vitamin D deficiency    Past Surgical History:  Procedure Laterality Date   CARDIAC CATHETERIZATION  2005   no CAD by cath at [facility]   LITHOTRIPSY     TONSILLECTOMY     TOTAL ABDOMINAL HYSTERECTOMY W/ BILATERAL SALPINGOOPHORECTOMY  1990   Patient Active Problem List   Diagnosis Date Noted   Greater trochanteric bursitis of right hip 09/04/2023   Primary osteoarthritis of right knee 09/04/2023   Grief 08/28/2023   Subclinical hyperthyroidism 08/28/2023   Age-related osteoporosis without current pathological fracture 06/18/2023   History of diverticulitis 06/18/2023   Hyperthyroidism 07/13/2019   Fall 07/13/2019   DVT (deep venous thrombosis) (HCC) 07/13/2019   Chronic kidney disease, stage 3 (HCC) 07/13/2019   Intractable vomiting with nausea 07/12/2019   Need for  prophylactic vaccination and inoculation against influenza 05/16/2013   Osteoarthritis 05/16/2013   Cellulitis of right arm 03/06/2013   Syncope 02/16/2013   Preoperative cardiovascular examination 02/16/2013   Shortness of breath 02/16/2013   Sinus tachycardia 02/16/2013   Asthma 02/08/2013   Potassium (K) deficiency 02/08/2013   Meniere's disease 02/08/2013   Weight gain 02/08/2013   Hypothyroidism 02/08/2013   History of DVT (deep vein thrombosis) 12/20/2011   History of kidney stones 12/20/2011   Personal history of goiter 12/20/2011   DJD (degenerative joint disease) 12/20/2011   Other and unspecified hyperlipidemia 12/20/2011   GERD (gastroesophageal reflux disease) 12/20/2011   Menopause 12/20/2011   Allergic rhinitis 12/20/2011    PCP: Tandy Gaw, PA-C  REFERRING PROVIDER: dr Rodney Langton  REFERRING DIAG: Primary OA R knee   THERAPY DIAG:  Chronic pain of right knee  Pain in right hip  Other symptoms and signs involving the musculoskeletal system  Muscle weakness (generalized)  Rationale for Evaluation and Treatment: Rehabilitation  ONSET DATE: 08/10/23  SUBJECTIVE:   SUBJECTIVE STATEMENT: 09/24/2023 Betty Atkinson reports that she feels "so so"today.  Patient reports that she wsa very sore from last treatment. She continues to have pain in the R hip and R knee pain. Hip is doing  much better since she had injections. She has been working on exercises at home. Needs a "whole new R side".  Notes that she has had more back pain since starting exercises for home.   From evaluation: Patient reports that she has had arthritis in the R knee for the past 40-50 years. She has received injections for years with most recent 09/04/23 of R hip and knee with improvement. She continues to have intermittent pain in R knee. She has had company over the past several days and has just "over done it".   PERTINENT HISTORY: Chronic kidney disease; arthritis; Meniere's disease with  surgery ~ 8 years ago; hx of LBP with injections x 2 in past; blood clots in R LE 2010  PAIN:  Are you having pain? Yes: NPRS scale: 4/10 Pain location: R knee  Pain description: throbbing; cramps in both calves  Aggravating factors: activity  Relieving factors: injections; topical analgesics   Are you having pain? Yes: NPRS scale: 0/10 Pain location: R hip  Pain description: N/A Aggravating factors: lying on R side  Relieving factors: injection   PRECAUTIONS: None  WEIGHT BEARING RESTRICTIONS: No  FALLS:  Has patient fallen in last 6 months? Yes. 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  DIAGNOSTIC FINDINGS: xrays R/L knees; hips/pelvis 09/04/23 - results not available    PATIENT SURVEYS:  LEFS 27/80; 33.8%  SENSATION: WFL  EDEMA:  Some edema R knee on an intermittent basis   MUSCLE LENGTH: Hamstrings: Right 45 deg; Left 55 deg Thomas test: tight Right  > Left  POSTURE: rounded shoulders, forward head, decreased lumbar lordosis, increased thoracic kyphosis, flexed trunk , and weight shift left  PALPATION: Tender peripatellar area; R posterior hip through piriformis and gluts   LOWER EXTREMITY ROM:  Active ROM Right eval Left eval  Hip flexion Tight end range  WNL's  Hip extension ~ 5 deg  ~ 10 deg   Hip abduction    Hip adduction     Hip internal rotation Tight    Hip external rotation Tight    Knee flexion ~ 80 deg prone ~ 85 deg prone   Knee extension WFL's  WFL's   Ankle dorsiflexion    Ankle plantarflexion    Ankle inversion    Ankle eversion     (Blank rows = not tested)  LOWER EXTREMITY MMT:  MMT Right eval Left eval  Hip flexion 4 4+  Hip extension 3+ 4-  Hip abduction 3+ 4  Hip adduction    Hip internal rotation    Hip external rotation    Knee flexion 4+ 5-  Knee extension 4 4+  Ankle dorsiflexion    Ankle plantarflexion    Ankle inversion    Ankle eversion     (Blank rows = not tested)   FUNCTIONAL TESTS:  5 times sit to stand: 19.92 sec -  some use of hands on table or hands on thighs - (typically moves slowly due to balance problems) SLS - R 6 sec; L 3 sec   GAIT: Distance walked: 40 ft Assistive device utilized: None Level of assistance: Complete Independence Comments: WFL's - flexed forward posture   [facility] Adult PT Treatment:                                                DATE: 09/24/23 Therapeutic Exercise: Nustep L5 x 5 min partial range to avoid knee flexion pain Supine 4 part core 10 sec x 10  Hamstring stretch hooklying with strap 30 sec x 2 R ITB stretch/lateral quad 30 sec x 2 R Quad set 5 sec x 5 R -  towel under knee  SLR small range 5 sec x 5 R Supine bridge small range 3 sec x 8  Sitting  Standing Gastroc stretch 20 sec x 2 R/L  Soleus stretch 20 sec x 2 R/L  SLS 5-10 sec with UE support as needed for balance  Self Care: Avoid sitting with ankles or knees crossed    [facility] Adult PT Treatment:                                                DATE: 09/19/2023 Therapeutic Exercise: Supine: Manual stretching R calf Manual stretching R tibialis posterior Manual stretching R posterolateral hip musculature Manual Therapy: Hooklying: R knee: AP and AP/lateral tibial glides working into greater amounts of R knee flexion R knee: inferior patellar glides working into  greater amounts of R knee flexion R knee: soft tissue mobilization to R calf and R posterior tibialis R hip: long axis posterior lateral glides working into greater amounts of R hip flexion R hip: long axis distraction working into greater amounts of R hip flexion R hip: soft tissue mobilization to posterolateral hip musculaure                                                                                                                               [facility] Adult PT Treatment:                                                DATE: 09/17/23 Therapeutic Exercise: Prone (did not assign prone exercises for home due to hx of back pain - some irritation today post ex) Glut set 5 sec x 5  Quad stretch with strap 20 sec x 2 R/L Quad set toe on table 5 sec x 5 R/L Supine Hamstring stretch hooklying with  strap 30 sec x 2 R/L Quad set 5 sec x 5 R -  towel under knee  SLR small range 5 sec x 5 R Standing SLS 5-10 sec with UE support as needed for balance  Self Care: Avoid sitting with ankles or knees crossed     PATIENT EDUCATION:  Education details: POC; HEP  Person educated: Patient Education method: Explanation, Demonstration, Tactile cues, Verbal cues, and Handouts Education comprehension: verbalized understanding, returned demonstration, verbal cues required, tactile cues required, and needs further education  HOME EXERCISE PROGRAM:  Access Code: [email or web address] URL: [email or web address] Date: 09/24/2023 Prepared by: Corlis Leak  Exercises - Hooklying Hamstring Stretch with Strap  - 2 x daily - 7 x weekly - 1 sets - 3 reps - 30 sec  hold - Supine Quad Set  - 2 x daily - 7 x weekly - 1 sets - 10 reps - 3 sec  hold - Small Range Straight Leg Raise  - 2 x daily - 7 x weekly - 1 sets - 10 reps - 5 sec  hold - Standing Single Leg Stance with Counter Support  - 2 x daily - 7 x weekly - 1 sets - 3-5 reps - 5-10 sec  hold - Supine Transversus Abdominis Bracing with Pelvic Floor  Contraction  - 2 x daily - 7 x weekly - 1 sets - 10 reps - 10sec  hold - Supine ITB Stretch with Strap  - 2 x daily - 7 x weekly - 1 sets - 3 reps - 30 sec  hold - Beginner Bridge  - 2 x daily - 7 x weekly - 1 sets - 10 reps - 3-5 sec  hold - Gastroc Stretch on Wall  - 2 x daily - 7 x weekly - 1 sets - 3 reps - 30 sec  hold - Soleus Stretch on Wall  - 2 x daily - 7 x weekly - 1 sets - 3 reps - 30 sec  hold ASSESSMENT:  CLINICAL IMPRESSION: 09/24/2023 Treatment consisted of review and progression of exercises for LE ROM and mobility as well as strengthening. Added core stabilization to address LBP and gastroc/soleus stretches to address calf cramping at night. Patient tolerated all exercises well with no increase in symptoms at the conclusion of treatment. Will continue focus on strengthening for gluts as pain allows.    From evaluation: Patient is a 78 y.o. female who was seen today for physical therapy evaluation and treatment for R hip and knee pain. She has a long standing history of R hip and knee pain and receives injections on a regular basis with good temporary pain management. Patient has limited ROM, mobility, strength in R > L LE; decreased balance; pain limiting functional activities. She has muscular tightness to palpation through the R piriformis and gluts. Patient will benefit from PT to address problems identified.   OBJECTIVE IMPAIRMENTS: decreased activity tolerance, decreased balance, decreased mobility, decreased ROM, decreased strength, increased edema, increased fascial restrictions, impaired flexibility, postural dysfunction, and pain.    GOALS: Goals reviewed with patient? Yes  SHORT TERM GOALS: Target date: 10/15/2023   Independent in initial HEP  Baseline: Goal status: INITIAL  2.  Patient demonstrates and reports greater ease with all transfers and bed mobility  Baseline:  Goal status: INITIAL   LONG TERM GOALS: Target date: 11/12/2023   Decrease R hip and knee  pain by 50-75%  Baseline:  Goal status: INITIAL  2.  Increase strength R LE to 4+/5 to 5/5  Baseline:  Goal status: INITIAL  3.  Decrease 5 times sit to stand time by 2-3 seconds  Baseline: 19.92 sec  Goal status: INITIAL  4.  Patient demonstrates increased SLS time to 10 sec R/L with minimal to no UE support  Baseline:  Goal status: INITIAL  5.  Independent in HEP including aquatic program as indicated  Baseline:  Goal status: INITIAL  6.  Improve LEFs score by 10-20 points  Baseline: LEFS 27/80; 33.8% Goal status: INITIAL   PLAN:  PT FREQUENCY: 2x/week  PT DURATION: 8 weeks  PLANNED INTERVENTIONS: 97110-Therapeutic exercises, 97530- Therapeutic activity, 97112- Neuromuscular re-education, 97535- Self Care, 16109- Manual therapy, [phone]- Gait training, [phone]- Aquatic Therapy, [phone]- Ultrasound, [phone]- Ionotophoresis 4mg /ml Dexamethasone, Patient/Family education, Balance training, Stair training, Taping, Dry Needling, Joint mobilization, Cryotherapy, and Moist heat  PLAN FOR NEXT SESSION: review and progress exercise; continue with body mechanics and ergonomic education; manual work and modalities as indicated    [employer], PT 09/24/2023, 11:43 AM

## 2023-09-26 ENCOUNTER — Ambulatory Visit: Admitting: Rehabilitative and Restorative Service Providers"

## 2023-09-26 ENCOUNTER — Encounter: Payer: Self-pay | Admitting: Rehabilitative and Restorative Service Providers"

## 2023-09-26 DIAGNOSIS — M6281 Muscle weakness (generalized): Secondary | ICD-10-CM | POA: Diagnosis not present

## 2023-09-26 DIAGNOSIS — G8929 Other chronic pain: Secondary | ICD-10-CM | POA: Diagnosis not present

## 2023-09-26 DIAGNOSIS — M1711 Unilateral primary osteoarthritis, right knee: Secondary | ICD-10-CM | POA: Diagnosis not present

## 2023-09-26 DIAGNOSIS — M25551 Pain in right hip: Secondary | ICD-10-CM

## 2023-09-26 DIAGNOSIS — M25561 Pain in right knee: Secondary | ICD-10-CM | POA: Diagnosis not present

## 2023-09-26 DIAGNOSIS — R29898 Other symptoms and signs involving the musculoskeletal system: Secondary | ICD-10-CM

## 2023-09-26 NOTE — Therapy (Signed)
 OUTPATIENT PHYSICAL THERAPY LOWER EXTREMITY TREATMENT   Patient Name: Betty Atkinson MRN: [id number] DOB:January 02, 1946, 78 y.o., female Today's Date: 09/26/2023  END OF SESSION:  PT End of Session - 09/26/23 1317     Visit Number 4    Number of Visits 16    Date for PT Re-Evaluation 11/12/23    Authorization Type [employer] $10 copay    Authorization Time Period no auth required    Progress Note Due on Visit 10    PT Start Time 1315    PT Stop Time 1400    PT Time Calculation (min) 45 min    Activity Tolerance Patient tolerated treatment well              Past Medical History:  Diagnosis Date   Allergic rhinitis    Asthma    DVT (deep venous thrombosis) (HCC) 2010   R leg, treated with coumadin x 6 months   GERD (gastroesophageal reflux disease)    Hearing disorder    R ear deafness   Hyperlipemia    Kidney calculi    recurrent, lithotripsy   Meniere's disease    Multinodular goiter    Osteoarthritis    Osteoporosis    Post-menopausal    Postural dizziness    postural syncope x 2   Renal insufficiency    Rosacea    Venous insufficiency of leg    post thrombotic syndrome   Vitamin D deficiency    Past Surgical History:  Procedure Laterality Date   CARDIAC CATHETERIZATION  2005   no CAD by cath at [facility]   LITHOTRIPSY     TONSILLECTOMY     TOTAL ABDOMINAL HYSTERECTOMY W/ BILATERAL SALPINGOOPHORECTOMY  1990   Patient Active Problem List   Diagnosis Date Noted   Greater trochanteric bursitis of right hip 09/04/2023   Primary osteoarthritis of right knee 09/04/2023   Grief 08/28/2023   Subclinical hyperthyroidism 08/28/2023   Age-related osteoporosis without current pathological fracture 06/18/2023   History of diverticulitis 06/18/2023   Hyperthyroidism 07/13/2019   Fall 07/13/2019   DVT (deep venous thrombosis) (HCC) 07/13/2019   Chronic kidney disease, stage 3 (HCC) 07/13/2019   Intractable vomiting with nausea 07/12/2019   Need for  prophylactic vaccination and inoculation against influenza 05/16/2013   Osteoarthritis 05/16/2013   Cellulitis of right arm 03/06/2013   Syncope 02/16/2013   Preoperative cardiovascular examination 02/16/2013   Shortness of breath 02/16/2013   Sinus tachycardia 02/16/2013   Asthma 02/08/2013   Potassium (K) deficiency 02/08/2013   Meniere's disease 02/08/2013   Weight gain 02/08/2013   Hypothyroidism 02/08/2013   History of DVT (deep vein thrombosis) 12/20/2011   History of kidney stones 12/20/2011   Personal history of goiter 12/20/2011   DJD (degenerative joint disease) 12/20/2011   Other and unspecified hyperlipidemia 12/20/2011   GERD (gastroesophageal reflux disease) 12/20/2011   Menopause 12/20/2011   Allergic rhinitis 12/20/2011    PCP: Betty Gaw, PA-C  REFERRING PROVIDER: dr Betty Atkinson  REFERRING DIAG: Primary OA R knee   THERAPY DIAG:  Chronic pain of right knee  Pain in right hip  Other symptoms and signs involving the musculoskeletal system  Muscle weakness (generalized)  Rationale for Evaluation and Treatment: Rehabilitation  ONSET DATE: 08/10/23  SUBJECTIVE:   SUBJECTIVE STATEMENT: 09/26/2023 Betty Atkinson reports that she feels some better today. Patient reports that she had some soreness in her ankles - has weak ankles and has ongoing LBP. She has not had cramps in her legs  for the past two nights doing the stretches from last visit. She has not done any other exercises. Notes that she has some back pain and some pain across her shoulders since starting exercises.  From evaluation: Patient reports that she has had arthritis in the R knee for the past 40-50 years. She has received injections for years with most recent 09/04/23 of R hip and knee with improvement. She continues to have intermittent pain in R knee. She has had company over the past several days and has just "over done it".   PERTINENT HISTORY: Chronic kidney disease; arthritis; Meniere's  disease with surgery ~ 8 years ago; hx of LBP with injections x 2 in past; blood clots in R LE 2010  PAIN:  Are you having pain? Yes: NPRS scale: 0/10 Pain location: R knee  Pain description: throbbing; cramps in both calves  Aggravating factors: activity  Relieving factors: injections; topical analgesics   Are you having pain? Yes: NPRS scale: 0/10 Pain location: R hip  Pain description: N/A Aggravating factors: lying on R side  Relieving factors: injection   PRECAUTIONS: None  WEIGHT BEARING RESTRICTIONS: No  FALLS:  Has patient fallen in last 6 months? Yes. 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  DIAGNOSTIC FINDINGS: xrays R/L knees; hips/pelvis 09/04/23 - results not available    PATIENT SURVEYS:  LEFS 27/80; 33.8%  SENSATION: WFL  EDEMA:  Some edema R knee on an intermittent basis   MUSCLE LENGTH: Hamstrings: Right 45 deg; Left 55 deg Thomas test: tight Right  > Left  POSTURE: rounded shoulders, forward head, decreased lumbar lordosis, increased thoracic kyphosis, flexed trunk , and weight shift left  PALPATION: Tender peripatellar area; R posterior hip through piriformis and gluts   LOWER EXTREMITY ROM:  Active ROM Right eval Left eval  Hip flexion Tight end range  WNL's  Hip extension ~ 5 deg  ~ 10 deg   Hip abduction     Hip adduction    Hip internal rotation Tight    Hip external rotation Tight    Knee flexion ~ 80 deg prone ~ 85 deg prone   Knee extension WFL's  WFL's   Ankle dorsiflexion    Ankle plantarflexion    Ankle inversion    Ankle eversion     (Blank rows = not tested)  LOWER EXTREMITY MMT:  MMT Right eval Left eval  Hip flexion 4 4+  Hip extension 3+ 4-  Hip abduction 3+ 4  Hip adduction    Hip internal rotation    Hip external rotation    Knee flexion 4+ 5-  Knee extension 4 4+  Ankle dorsiflexion    Ankle plantarflexion    Ankle inversion    Ankle eversion     (Blank rows = not tested)   FUNCTIONAL TESTS:  5 times sit to stand: 19.92 sec -  some use of hands on table or hands on thighs - (typically moves slowly due to balance problems) SLS - R 6 sec; L 3 sec   GAIT: Distance walked: 40 ft Assistive device utilized: None Level of assistance: Complete Independence Comments: WFL's - flexed forward posture   [facility] Adult PT Treatment:                                                DATE: 09/26/23 Therapeutic Exercise: Nustep L5 x 5 min partial range to avoid knee flexion pain Supine 4 part core 10 sec x 10  Hamstring stretch hooklying with strap 30 sec x 2 R ITB stretch/lateral quad 30 sec x 2 R (resting R leg on L bent knee) Quad set 5 sec x 10 R -  towel under knee  SLR small range 5 sec x 5 R Supine bridge small range 3 sec x 8  Sitting  Sit to stand with UE support x 5 Scap squeeze/chest lift  Prayer stretch palms together 10 sec x 3 Biceps stretch hands behind back 10 sec x 2   Standing Gastroc stretch 20 sec x 2 R/L  Soleus stretch 20 sec x 2 R/L  SLS 5-10 sec with UE support as needed for balance  Self Care: Avoid sitting with ankles or knees crossed    [facility] Adult PT Treatment:                                                DATE: 09/24/23 Therapeutic Exercise: Nustep L5 x 5 min partial range to avoid knee flexion pain Supine 4 part core 10 sec x 10   Hamstring stretch hooklying with strap 30 sec x 2 R ITB stretch/lateral quad 30 sec x 2 R Quad set 5 sec x 5 R -  towel under knee  SLR small range 5 sec x 5 R Supine bridge small range 3 sec x 8  Sitting  Standing Gastroc stretch 20 sec x 2 R/L  Soleus stretch 20 sec x 2 R/L  SLS 5-10 sec with UE support as needed for balance  Self Care: Avoid sitting with ankles or knees crossed    [facility] Adult PT Treatment:                                                DATE: 09/19/2023 Therapeutic Exercise: Supine: Manual stretching R calf Manual stretching R tibialis posterior Manual stretching R posterolateral hip musculature Manual Therapy: Hooklying: R knee: AP and AP/lateral tibial glides working into greater amounts of R knee flexion R knee: inferior patellar glides working into greater amounts of R knee flexion R knee: soft tissue mobilization to R calf and R posterior tibialis R hip: long axis posterior lateral glides working into greater amounts of R hip flexion R hip: long axis distraction working into greater amounts of R hip flexion R hip: soft tissue mobilization to posterolateral hip musculaure  [facility] Adult PT Treatment:                                                DATE: 09/17/23 Therapeutic Exercise: Prone (did not assign prone exercises for home due to hx of back pain - some irritation today post ex) Glut set 5 sec x 5  Quad stretch with strap 20 sec x 2 R/L Quad set toe on table 5 sec x 5 R/L Supine Hamstring stretch hooklying with strap 30 sec x 2 R/L Quad set 5 sec x 5 R -  towel under knee  SLR small range 5 sec x 5 R Standing SLS 5-10 sec with UE support as needed for balance  Self Care: Avoid sitting with ankles or knees crossed     PATIENT EDUCATION:  Education details: POC; HEP  Person educated: Patient Education method: Explanation,  Demonstration, Tactile cues, Verbal cues, and Handouts Education comprehension: verbalized understanding, returned demonstration, verbal cues required, tactile cues required, and needs further education  HOME EXERCISE PROGRAM:  Access Code: [email or web address] URL: [email or web address] Date: 09/26/2023 Prepared by: Corlis Leak  Exercises - Hooklying Hamstring Stretch with Strap  - 2 x daily - 7 x weekly - 1 sets - 3 reps - 30 sec  hold - Supine Quad Set  - 2 x daily - 7 x weekly - 1 sets - 10 reps - 3 sec  hold - Small Range Straight Leg Raise  - 2 x daily - 7 x weekly - 1 sets - 10 reps - 5 sec  hold - Standing Single Leg Stance with Counter Support  - 2 x daily - 7 x weekly - 1 sets - 3-5 reps - 5-10 sec  hold - Supine Transversus Abdominis Bracing with Pelvic Floor Contraction  - 2 x daily - 7 x weekly - 1 sets - 10 reps - 10sec  hold - Supine ITB Stretch with Strap  - 2 x daily - 7 x weekly - 1 sets - 3 reps - 30 sec  hold - Beginner Bridge  - 2 x daily - 7 x weekly - 1 sets - 10 reps - 3-5 sec  hold - Gastroc Stretch on Wall  - 2 x daily - 7 x weekly - 1 sets - 3 reps - 30 sec  hold - Soleus Stretch on Wall  - 2 x daily - 7 x weekly - 1 sets - 3 reps - 30 sec  hold - Seated Wrist Prayer Stretch  - 2 x daily - 7 x weekly - 1 sets - 3 reps - 10 sec  hold - Supine Scapular Retraction  - 2 x daily - 7 x weekly - 1 sets - 10 reps - 5-10 sec  hold - Seated Scapular Retraction  - 2 x daily - 7 x weekly - 1-2 sets - 10 reps - 10 sec  hold - Standing Backward Shoulder Rolls  - 2 x daily - 7 x weekly - 1 sets - 10 reps - 1-2 sec  hold - Sit to Stand  - 2 x daily - 7 x weekly - 1 sets - 5-10 reps - 3-5 sec  hold  ASSESSMENT:  CLINICAL IMPRESSION: 09/26/2023 Treatment consisted of review and progression of exercises for LE ROM and mobility as well as strengthening. Stretch for gastroc/soleus stretches eliminated calf cramping at night. Patient  tolerated all exercises well with no increase in  symptoms at the conclusion of treatment. Will continue focus on strengthening for gluts as pain allows.    From evaluation: Patient is a 78 y.o. female who was seen today for physical therapy evaluation and treatment for R hip and knee pain. She has a long standing history of R hip and knee pain and receives injections on a regular basis with good temporary pain management. Patient has limited ROM, mobility, strength in R > L LE; decreased balance; pain limiting functional activities. She has muscular tightness to palpation through the R piriformis and gluts. Patient will benefit from PT to address problems identified.   OBJECTIVE IMPAIRMENTS: decreased activity tolerance, decreased balance, decreased mobility, decreased ROM, decreased strength, increased edema, increased fascial restrictions, impaired flexibility, postural dysfunction, and pain.    GOALS: Goals reviewed with patient? Yes  SHORT TERM GOALS: Target date: 10/15/2023   Independent in initial HEP  Baseline: Goal status: INITIAL  2.  Patient demonstrates and reports greater ease with all transfers and bed mobility  Baseline:  Goal status: INITIAL   LONG TERM GOALS: Target date: 11/12/2023   Decrease R hip and knee pain by 50-75%  Baseline:  Goal status: INITIAL  2.  Increase strength R LE to 4+/5 to 5/5  Baseline:  Goal status: INITIAL  3.  Decrease 5 times sit to stand time by 2-3 seconds  Baseline: 19.92 sec  Goal status: INITIAL  4.  Patient demonstrates increased SLS time to 10 sec R/L with minimal to no UE support  Baseline:  Goal status: INITIAL  5.  Independent in HEP including aquatic program as indicated  Baseline:  Goal status: INITIAL  6.  Improve LEFs score by 10-20 points  Baseline: LEFS 27/80; 33.8% Goal status: INITIAL   PLAN:  PT FREQUENCY: 2x/week  PT DURATION: 8 weeks  PLANNED INTERVENTIONS: 97110-Therapeutic exercises, 97530- Therapeutic activity, 97112- Neuromuscular re-education,  97535- Self Care, 16109- Manual therapy, [phone]- Gait training, [phone]- Aquatic Therapy, [phone]- Ultrasound, [phone]- Ionotophoresis 4mg /ml Dexamethasone, Patient/Family education, Balance training, Stair training, Taping, Dry Needling, Joint mobilization, Cryotherapy, and Moist heat  PLAN FOR NEXT SESSION: review and progress exercise; continue with body mechanics and ergonomic education; manual work and modalities as indicated    [employer], PT 09/26/2023, 1:18 PM

## 2023-10-01 ENCOUNTER — Ambulatory Visit: Admitting: Rehabilitative and Restorative Service Providers"

## 2023-10-03 ENCOUNTER — Ambulatory Visit: Admitting: Rehabilitative and Restorative Service Providers"

## 2023-10-03 ENCOUNTER — Encounter: Payer: Self-pay | Admitting: Rehabilitative and Restorative Service Providers"

## 2023-10-03 DIAGNOSIS — M25561 Pain in right knee: Secondary | ICD-10-CM | POA: Diagnosis not present

## 2023-10-03 DIAGNOSIS — G8929 Other chronic pain: Secondary | ICD-10-CM | POA: Diagnosis not present

## 2023-10-03 DIAGNOSIS — M25551 Pain in right hip: Secondary | ICD-10-CM

## 2023-10-03 DIAGNOSIS — M6281 Muscle weakness (generalized): Secondary | ICD-10-CM

## 2023-10-03 DIAGNOSIS — M1711 Unilateral primary osteoarthritis, right knee: Secondary | ICD-10-CM | POA: Diagnosis not present

## 2023-10-03 DIAGNOSIS — R29898 Other symptoms and signs involving the musculoskeletal system: Secondary | ICD-10-CM

## 2023-10-03 NOTE — Therapy (Addendum)
 OUTPATIENT PHYSICAL THERAPY LOWER EXTREMITY TREATMENT PHYSICAL THERAPY DISCHARGE SUMMARY  Visits from Start of Care: 5  Current functional level related to goals / functional outcomes: See progress note for discharge status    Remaining deficits: Improvement noted in R hip and knee    Education / Equipment: HEP   Patient agrees to discharge. Patient goals were partially met. Patient is being discharged due to not returning since the last visit.  Shandrea Lusk P. Geraldo Klippel PT, MPH 10/31/23 10:05 AM     Patient Name: Betty Atkinson MRN: [id number] DOB:[birth year], 78 y.o., female Today's Date: 10/03/2023  END OF SESSION:  PT End of Session - 10/03/23 1358     Visit Number 5    Number of Visits 16    Date for PT Re-Evaluation 11/12/23    Authorization Type [employer] $10 copay    Authorization Time Period no auth required    Progress Note Due on Visit 10    PT Start Time 1358    PT Stop Time 1443    PT Time Calculation (min) 45 min    Activity Tolerance Patient tolerated treatment well              Past Medical History:  Diagnosis Date   Allergic rhinitis    Asthma    DVT (deep venous thrombosis) (HCC) 2010   R leg, treated with coumadin x 6 months   GERD (gastroesophageal reflux disease)    Hearing disorder    R ear deafness   Hyperlipemia    Kidney calculi    recurrent, lithotripsy   Meniere's disease    Multinodular goiter    Osteoarthritis    Osteoporosis    Post-menopausal    Postural dizziness    postural syncope x 2   Renal insufficiency    Rosacea    Venous insufficiency of leg    post thrombotic syndrome   Vitamin D  deficiency    Past Surgical History:  Procedure Laterality Date   CARDIAC CATHETERIZATION  2005   no CAD by cath at [facility]   LITHOTRIPSY     TONSILLECTOMY     TOTAL ABDOMINAL HYSTERECTOMY W/ BILATERAL SALPINGOOPHORECTOMY  1990   Patient Active Problem List   Diagnosis Date Noted   Greater trochanteric bursitis of right hip  09/04/2023   Primary osteoarthritis of right knee 09/04/2023   Grief 08/28/2023   Subclinical hyperthyroidism 08/28/2023   Age-related osteoporosis without current pathological fracture 06/18/2023   History of diverticulitis 06/18/2023   Hyperthyroidism 07/13/2019   Fall 07/13/2019   DVT (deep venous thrombosis) (HCC) 07/13/2019   Chronic kidney disease, stage 3 (HCC) 07/13/2019   Intractable vomiting with nausea 07/12/2019   Need for prophylactic vaccination and inoculation against influenza 05/16/2013   Osteoarthritis 05/16/2013   Cellulitis of right arm 03/06/2013   Syncope 02/16/2013   Preoperative cardiovascular examination 02/16/2013   Shortness of breath 02/16/2013   Sinus tachycardia 02/16/2013   Asthma 02/08/2013   Potassium (K) deficiency 02/08/2013   Meniere's disease 02/08/2013   Weight gain 02/08/2013   Hypothyroidism 02/08/2013   History of DVT (deep vein thrombosis) 12/20/2011   History of kidney stones 12/20/2011   Personal history of goiter 12/20/2011   DJD (degenerative joint disease) 12/20/2011   Other and unspecified hyperlipidemia 12/20/2011   GERD (gastroesophageal reflux disease) 12/20/2011   Menopause 12/20/2011   Allergic rhinitis 12/20/2011    PCP: Sandy Crumb, PA-C  REFERRING PROVIDER: dr Annemarie Kil  REFERRING DIAG: Primary OA R knee  THERAPY DIAG:  Chronic pain of right knee  Pain in right hip  Other symptoms and signs involving the musculoskeletal system  Muscle weakness (generalized)  Rationale for Evaluation and Treatment: Rehabilitation  ONSET DATE: 08/10/23  SUBJECTIVE:   SUBJECTIVE STATEMENT: 10/03/2023 Betty Atkinson reports that her knee and hip are feeling better. She is having pain in the R shoulder area for the past 4-5 days. She is not sure what she did to irritate symptoms in the shoulder. She is working on her exercises at home. She is pleased that she has not had cramps in her legs since she started the calf stretches.  Patient notes that she can feel some stretch with corner stretch.  From evaluation: Patient reports that she has had arthritis in the R knee for the past 40-50 years. She has received injections for years with most recent 09/04/23 of R hip and knee with improvement. She continues to have intermittent pain in R knee. She has had company over the past several days and has just "over done it".   PERTINENT HISTORY: Chronic kidney disease; arthritis; Meniere's disease with surgery ~ 8 years ago; hx of LBP with injections x 2 in past; blood clots in R LE 2010  PAIN:  Are you having pain? Yes: NPRS scale: 0/10 Pain location: R knee  Pain description: throbbing; cramps in both calves  Aggravating factors: activity  Relieving factors: injections; topical analgesics   Are you having pain? Yes: NPRS scale: 0/10 Pain location: R hip  Pain description: N/A Aggravating factors: lying on R side  Relieving factors: injection   PRECAUTIONS: None  WEIGHT BEARING RESTRICTIONS: No  FALLS:  Has patient fallen in last 6 months? Yes. 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  DIAGNOSTIC FINDINGS: xrays R/L knees; hips/pelvis 09/04/23 - results not available    PATIENT SURVEYS:  LEFS 27/80; 33.8%  SENSATION: WFL  EDEMA:  Some edema R knee on an intermittent basis    MUSCLE LENGTH: Hamstrings: Right 45 deg; Left 55 deg Thomas test: tight Right  > Left  POSTURE: rounded shoulders, forward head, decreased lumbar lordosis, increased thoracic kyphosis, flexed trunk , and weight shift left  PALPATION: Tender peripatellar area; R posterior hip through piriformis and gluts   LOWER EXTREMITY ROM:  Active ROM Right eval Left eval  Hip flexion Tight end range  WNL's  Hip extension ~ 5 deg  ~ 10 deg   Hip abduction    Hip adduction    Hip internal rotation Tight    Hip external rotation Tight    Knee flexion ~ 80 deg prone ~ 85 deg prone   Knee extension WFL's  WFL's   Ankle dorsiflexion    Ankle plantarflexion    Ankle inversion    Ankle eversion     (Blank rows = not tested)  LOWER EXTREMITY MMT:  re-eval strength assessed  with patient in supine due to poor tolerance for prone with back pain  MMT Right eval 10/03/23 Left eval 10/03/23  Hip flexion 4 4+ 4+ 5  Hip extension 3+ 4- 4- 4  Hip abduction 3+ 4- 4 4  Hip adduction      Hip internal rotation      Hip external rotation      Knee flexion 4+ 5- 5- 5  Knee extension 4 4+ 4+ 5-  Ankle dorsiflexion      Ankle plantarflexion      Ankle inversion      Ankle eversion       (Blank rows = not tested)   FUNCTIONAL TESTS:  5 times sit to stand: 19.92 sec - some use of hands on table or hands on thighs - (typically moves slowly due to balance problems) 10/03/23: 5 times sit to stand 17.98 sec with some use of hands on table for some assist  SLS - R 6 sec; L 3 sec  10/03/23: : R/L 10 sec no UE assist   GAIT: Distance walked: 40 ft Assistive device utilized: None Level of assistance: Complete Independence Comments: WFL's - flexed forward posture   [facility] Adult PT Treatment:                                                DATE: 10/03/23 Therapeutic Exercise: Nustep L5 x 5 min partial range to avoid knee flexion pain Supine 4 part core 10 sec x 10  Supine bridge small range 3 sec x 8   Hip abduction alternating LE's green TB x 5 R/L  Sitting  Sit to stand with UE support x 5 Scap squeeze/chest lift  Prayer stretch palms together 10 sec x 3 Biceps stretch hands behind back 10 sec x 2   Standing Gastroc stretch 20 sec x 2 R/L  Soleus stretch 20 sec x 2 R/L  Hamstring stretch sitting 20 sec x 2 R/L VC for upright spine hinged hip  SLS 10 sec with no UE support as needed for balance  Pec stretch corner 20 sec x 2  Self Care: Avoid sitting with ankles or knees crossed  Modifications for sitting to read or work puzzles using lap desk and pillows  Importance of consistent exercises - especially strengthening  Modifications of HEP to improve compliance    [facility] Adult PT Treatment:                                                DATE: 09/26/23 Therapeutic Exercise: Nustep L5 x 5 min partial range to avoid knee flexion pain Supine 4 part core 10 sec x 10  Hamstring stretch hooklying with strap 30 sec x 2 R ITB stretch/lateral quad 30 sec x 2 R (resting R leg on L bent knee) Quad set 5 sec x 10 R -  towel under knee  SLR small range 5 sec x 5 R Supine bridge small range 3 sec x 8  Sitting  Sit to stand with UE support x 5 Scap squeeze/chest lift  Prayer stretch palms together 10 sec x 3 Biceps stretch hands behind back 10 sec x 2   Standing Gastroc stretch 20 sec x 2 R/L  Soleus stretch 20 sec x 2 R/L  SLS 5-10 sec with UE support as needed for balance  Self Care: Avoid sitting with ankles or knees crossed    [facility] Adult PT Treatment:                                                DATE: 09/24/23 Therapeutic Exercise: Nustep L5 x 5 min partial range to avoid knee flexion pain Supine 4 part core 10 sec x 10  Hamstring stretch hooklying with strap 30 sec x 2 R ITB stretch/lateral quad 30 sec x 2 R Quad set 5 sec x 5 R -  towel under knee  SLR small range 5 sec x 5 R Supine bridge small range 3 sec x 8  Sitting  Standing Gastroc stretch 20 sec x 2 R/L  Soleus  stretch 20 sec x 2 R/L  SLS 5-10 sec with UE support as needed for balance  Self Care: Avoid sitting with ankles or knees crossed    [facility] Adult PT Treatment:                                                DATE: 09/19/2023 Therapeutic Exercise: Supine: Manual stretching R calf Manual stretching R tibialis posterior Manual stretching R posterolateral hip musculature Manual Therapy: Hooklying: R knee: AP and AP/lateral tibial glides working into greater amounts of R knee flexion R knee: inferior patellar glides working into greater amounts of R knee flexion R knee: soft tissue mobilization to R calf and R posterior tibialis R hip: long axis posterior lateral glides working into greater amounts of R hip flexion R hip: long axis distraction working into greater amounts of R hip flexion R hip: soft tissue mobilization to posterolateral hip musculaure                                                                                                                               [facility] Adult PT Treatment:                                                DATE: 09/17/23 Therapeutic Exercise: Prone (did not assign prone exercises for home due to hx of back pain - some irritation today post ex) Glut set 5 sec x 5  Quad stretch with strap 20 sec x 2 R/L Quad set toe on table 5 sec x 5 R/L Supine Hamstring stretch hooklying with strap 30 sec x 2 R/L Quad set 5 sec x 5 R -  towel under knee  SLR small range 5 sec x 5 R Standing SLS 5-10 sec with UE support as needed for balance  Self Care: Avoid sitting with ankles or knees crossed     PATIENT EDUCATION:  Education details: POC; HEP  Person educated: Patient Education method: Explanation, Demonstration, Tactile cues, Verbal cues, and Handouts Education comprehension: verbalized understanding, returned demonstration, verbal cues required, tactile cues required, and needs further education  HOME EXERCISE PROGRAM:  Access Code: [email or web address] URL:  [email or web address] Date: 10/03/2023 Prepared by: Lindee Leason  Exercises - Standing Single Leg Stance with Counter Support  - 2 x daily - 7 x weekly - 1 sets - 3-5 reps - 5-10 sec  hold - Supine Transversus Abdominis Bracing with Pelvic Floor Contraction  - 2 x daily - 7 x weekly - 1 sets - 10 reps - 10sec  hold - Beginner Bridge  - 2 x daily - 7 x weekly - 1 sets - 10 reps - 3-5 sec  hold - Gastroc Stretch on Wall  - 2 x daily - 7 x weekly - 1 sets - 3 reps - 30 sec  hold - Soleus Stretch on Wall  - 2 x daily - 7 x weekly - 1 sets - 3 reps - 30 sec  hold - Seated Wrist Prayer Stretch  - 2 x daily - 7 x weekly - 1 sets - 3 reps - 10 sec  hold - Supine Scapular Retraction  - 2 x daily - 7 x weekly - 1 sets - 10 reps - 5-10 sec  hold - Seated Scapular Retraction  - 2 x daily - 7 x weekly - 1-2 sets - 10 reps - 10 sec  hold - Standing Backward Shoulder Rolls  - 2 x daily - 7 x weekly - 1 sets - 10 reps - 1-2 sec  hold - Sit to Stand  - 2 x daily - 7 x weekly - 1 sets - 5-10 reps - 3-5 sec  hold - Corner Pec Major Stretch  - 2 x daily - 7 x weekly - 1 sets - 3 reps - 20-30 sec  hold - Hooklying Isometric Clamshell  - 2 x daily - 7 x weekly - 1 sets - 10 reps - 3 sec  hold - Seated Hamstring Stretch  - 2 x daily - 7 x weekly - 1 sets - 3 reps - 30 sec  hold  ASSESSMENT:  CLINICAL IMPRESSION: 10/03/2023 Treatment consisted of review and progression of exercises for LE ROM and mobility as well as strengthening. Modified exercises for HEP to improve compliance with home program. Addressed R upper quarter tightness and pain with postural corrections and gentle pec stretch. Re-assessment demonstrates good improvement. Patient has accomplished all goals except full gains in strength LE. She demonstrates increased strength but not to 4+/5 to 5/5 in all muscle groups. Patient instructed in the importance of continuing exercises on a consistent basis for at minimum 3-4 months for good strength  gains. Stretch for gastroc/soleus stretches eliminated calf cramping at night. Patient tolerated all exercises well with no increase in symptoms at the conclusion of treatment. Will d/c to independent HEP at patient's request.    From evaluation: Patient is a 78 y.o. female who was seen today for physical therapy evaluation and treatment for R hip and knee pain. She has a long standing history of R hip and knee pain and receives injections on a regular basis with good temporary pain management. Patient has limited ROM, mobility, strength  in R > L LE; decreased balance; pain limiting functional activities. She has muscular tightness to palpation through the R piriformis and gluts. Patient will benefit from PT to address problems identified.   OBJECTIVE IMPAIRMENTS: decreased activity tolerance, decreased balance, decreased mobility, decreased ROM, decreased strength, increased edema, increased fascial restrictions, impaired flexibility, postural dysfunction, and pain.    GOALS: Goals reviewed with patient? Yes  SHORT TERM GOALS: Target date: 10/15/2023   Independent in initial HEP  Baseline: Goal status: met  2.  Patient demonstrates and reports greater ease with all transfers and bed mobility  Baseline:  Goal status:met   LONG TERM GOALS: Target date: 11/12/2023   Decrease R hip and knee pain by 50-75%  Baseline:  Goal status: INITIAL  2.  Increase strength R LE to 4+/5 to 5/5  Baseline:  Goal status: partially accomplished   3.  Decrease 5 times sit to stand time by 2-3 seconds  Baseline: 19.92 sec  Goal status: met  4.  Patient demonstrates increased SLS time to 10 sec R/L with minimal to no UE support  Baseline:  Goal status:met  5.  Independent in HEP including aquatic program as indicated  Baseline:  Goal status: met  6.  Improve LEFs score by 10-20 points  Baseline: LEFS 27/80; 33.8% 10/03/23: 43/80; 53.8% Goal status: met   PLAN:  PT FREQUENCY: 2x/week  PT  DURATION: 8 weeks  PLANNED INTERVENTIONS: 97110-Therapeutic exercises, 97530- Therapeutic activity, 97112- Neuromuscular re-education, 97535- Self Care, 09811- Manual therapy, [phone]- Gait training, [phone]- Aquatic Therapy, [phone]- Ultrasound, [phone]- Ionotophoresis 4mg /ml Dexamethasone, Patient/Family education, Balance training, Stair training, Taping, Dry Needling, Joint mobilization, Cryotherapy, and Moist heat  PLAN FOR NEXT SESSION: review and progress exercise; continue with body mechanics and ergonomic education; manual work and modalities as indicated    Beya Tipps P Bayan Kushnir, PT 10/03/2023, 1:59 PM

## 2023-10-04 DIAGNOSIS — M810 Age-related osteoporosis without current pathological fracture: Secondary | ICD-10-CM | POA: Diagnosis not present

## 2023-10-04 DIAGNOSIS — Z1330 Encounter for screening examination for mental health and behavioral disorders, unspecified: Secondary | ICD-10-CM | POA: Diagnosis not present

## 2023-10-04 DIAGNOSIS — E0590 Thyrotoxicosis, unspecified without thyrotoxic crisis or storm: Secondary | ICD-10-CM | POA: Diagnosis not present

## 2023-10-08 ENCOUNTER — Encounter: Admitting: Rehabilitative and Restorative Service Providers"

## 2023-10-10 ENCOUNTER — Other Ambulatory Visit: Payer: Self-pay | Admitting: Physician Assistant

## 2023-10-14 ENCOUNTER — Encounter

## 2023-10-16 ENCOUNTER — Ambulatory Visit: Payer: Medicare Other | Admitting: Sports Medicine

## 2023-10-17 ENCOUNTER — Encounter

## 2023-10-17 ENCOUNTER — Ambulatory Visit

## 2023-10-17 VITALS — Ht 60.0 in | Wt 146.0 lb

## 2023-10-17 DIAGNOSIS — Z0000 Encounter for general adult medical examination without abnormal findings: Secondary | ICD-10-CM

## 2023-10-17 MED ORDER — ALBUTEROL SULFATE HFA 108 (90 BASE) MCG/ACT IN AERS: 1.0000 | INHALATION_SPRAY | Freq: Four times a day (QID) | RESPIRATORY_TRACT | 1 refills | Status: AC | PRN

## 2023-10-17 NOTE — Progress Notes (Signed)
 Subjective:   Betty Atkinson is a 78 y.o. female who presents for Medicare Annual (Subsequent) preventive examination.  Visit Complete: Virtual I connected with  Betty Atkinson on 10/17/23 by a audio enabled telemedicine application and verified that I am speaking with the correct person using two identifiers.  Patient Location: Home  Provider Location: Office/Clinic  I discussed the limitations of evaluation and management by telemedicine. The patient expressed understanding and agreed to proceed.  Vital Signs: Because this visit was a virtual/telehealth visit, some criteria may be missing or patient reported. Any vitals not documented were not able to be obtained and vitals that have been documented are patient reported.  Patient Medicare AWV questionnaire was completed by the patient on n/a; I have confirmed that all information answered by patient is correct and no changes since this date.  Cardiac Risk Factors include: advanced age (>23men, >59 women);dyslipidemia;hypertension     Objective:    Today's Vitals   10/17/23 0956  Weight: 146 lb (66.2 kg)  Height: 5' (1.524 m)   Body mass index is 28.51 kg/m.     10/17/2023   10:15 AM 09/17/2023   10:23 AM 06/18/2023   11:00 AM 05/11/2021    2:18 PM 07/13/2019   11:00 PM 07/12/2019    8:56 PM 07/10/2019    5:24 PM  Advanced Directives  Does Patient Have a Medical Advance Directive? Yes Yes Yes No Yes Yes Yes  Type of Advance Directive Living will Healthcare Power of [place];Living will Healthcare Power of [place];Living will;Out of facility DNR (pink MOST or yellow form)  Healthcare Power of [place];Living will Healthcare Power of [place];Living will Healthcare Power of [place];Living will  Does patient want to make changes to medical advance directive? No - Patient declined    No - Patient declined No - Patient declined   Copy of Healthcare Power of Attorney in Chart?  No - copy requested No - copy requested  No - copy requested No -  copy requested No - copy requested  Would patient like information on creating a medical advance directive?     No - Guardian declined No - Guardian declined No - Guardian declined    Current Medications (verified) Outpatient Encounter Medications as of 10/17/2023  Medication Sig   albuterol (PROVENTIL HFA;VENTOLIN HFA) 108 (90 BASE) MCG/ACT inhaler Inhale 1-2 puffs into the lungs every 6 (six) hours as needed for wheezing or shortness of breath.    aspirin 81 MG chewable tablet Chew by mouth daily.   Azelaic Acid 15 % gel APPLY SPARINGLY TO AFFECTED AREA(S)   cetirizine (ZYRTEC) 10 MG tablet Take 10 mg by mouth daily.   Cholecalciferol 50 MCG (2000 UT) CAPS Take 2,000 Units by mouth daily.   famotidine (PEPCID) 20 MG tablet TAKE ONE TABLET BY MOUTH TWICE A DAY   Magnesium 500 MG CAPS Take 1 capsule by mouth daily.     melatonin 3 MG TABS tablet Take 3 mg by mouth at bedtime.   methimazole (TAPAZOLE) 5 MG tablet Take 10 mg by mouth daily.   metoprolol succinate (TOPROL-XL) 50 MG 24 hr tablet Take 1 tablet (50 mg total) by mouth daily.   mometasone (NASONEX) 50 MCG/ACT nasal spray Place 2 sprays into the nose at bedtime.     montelukast (SINGULAIR) 10 MG tablet Take 1 tablet (10 mg total) by mouth at bedtime.   potassium chloride (KLOR-CON M) 10 MEQ tablet Take 1 tablet by mouth 2 (two) times daily.   rosuvastatin (CRESTOR)  20 MG tablet TAKE ONE TABLET BY MOUTH ONE TIME DAILY   Fluticasone Propionate, Inhal, (FLOVENT DISKUS) 100 MCG/ACT AEPB Inhale into the lungs. (Patient not taking: Reported on 10/17/2023)   Omeprazole Magnesium (PRILOSEC OTC PO) Take 1 tablet by mouth daily.  (Patient not taking: Reported on 09/17/2023)   [DISCONTINUED] rosuvastatin (CRESTOR) 40 MG tablet Take 1/2 - 1 tab daily (Patient taking differently: Take 20 mg by mouth daily. )   [DISCONTINUED] vitamin E 400 UNIT capsule Take 400 Units by mouth daily.   (Patient not taking: Reported on 10/17/2023)   No  facility-administered encounter medications on file as of 10/17/2023.    Allergies (verified) Methotrexate derivatives, Lyrica [pregabalin], Other, Prednisone, and Sulfa antibiotics   History: Past Medical History:  Diagnosis Date   Allergic rhinitis    Asthma    DVT (deep venous thrombosis) (HCC) 2010   R leg, treated with coumadin x 6 months   GERD (gastroesophageal reflux disease)    Hearing disorder    R ear deafness   Hyperlipemia    Kidney calculi    recurrent, lithotripsy   Meniere's disease    Multinodular goiter    Osteoarthritis    Osteoporosis    Post-menopausal    Postural dizziness    postural syncope x 2   Renal insufficiency    Rosacea    Venous insufficiency of leg    post thrombotic syndrome   Vitamin D deficiency    Past Surgical History:  Procedure Laterality Date   CARDIAC CATHETERIZATION  2003-11-06   no CAD [facility] at [facility]   LITHOTRIPSY     TONSILLECTOMY     TOTAL ABDOMINAL HYSTERECTOMY W/ BILATERAL SALPINGOOPHORECTOMY  1990   Family History  Problem Relation Age of Onset   Heart disease Mother    Diabetes Mother    Thyroid disease Father    Heart disease Father    Cancer Brother        fibrohisteofibroma   Heart disease Brother    Thyroid disease Maternal Grandmother    Social History   Socioeconomic History   Marital status: Widowed    Spouse name: MICHAEL   Number of children: 2   Years of education: 12   Highest education level: Not on file  Occupational History   Occupation: Homemaker  Tobacco Use   Smoking status: Never   Smokeless tobacco: Never  Vaping Use   Vaping status: Never Used  Substance and Sexual Activity   Alcohol use: No   Drug use: No   Sexual activity: Yes    Partners: Male  Other Topics Concern   Not on file  Social History Narrative   Husband passed away November 06, 2022 Marital Status: Married [name]) Children:  Daughter [name]) Son (Mike)Pets:  None Living Situation: Lives with spouse in [place]  PointOccupation: Homemaker Education:  Engineer, agricultural Alcohol Use:  None Drug Use:  None Diet:  Low Salt Exercise:  None 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   Social Drivers of Corporate investment banker Strain: Low Risk  (10/17/2023)   Overall Financial Resource Strain (CARDIA)    Difficulty of Paying Living Expenses: Not hard at all  Food Insecurity: No Food Insecurity (10/17/2023)   Hunger Vital Sign    Worried About Running Out of Food in the Last Year: Never true    Ran Out of Food in the Last Year: Never true  Transportation Needs: No Transportation Needs (10/17/2023)   PRAPARE -  Administrator, Civil Service (Medical): No    Lack of Transportation (Non-Medical): No  Physical Activity: Insufficiently Active (10/17/2023)   Exercise Vital Sign    Days of Exercise per Week: 7 days    Minutes of Exercise per Session: 20 min  Stress: No Stress Concern Present (10/17/2023)   [employer] of Occupational Health - Occupational Stress Questionnaire    Feeling of Stress : Only a little  Social Connections: Moderately Integrated (10/17/2023)   Social Connection and Isolation Panel [NHANES]    Frequency of Communication with Friends and Family: More than three times a week    Frequency of Social Gatherings with Friends and Family: Three times a week    Attends Religious Services: More than 4 times per year    Active Member of Clubs or Organizations: Yes    Attends Banker Meetings: More than 4 times per year    Marital Status: Widowed    Tobacco Counseling Counseling given: Not Answered   Clinical Intake:  Pre-visit preparation completed: Yes  Pain : No/denies pain     BMI - recorded: 28.51 Nutritional Status: BMI 25 -29 Overweight Nutritional Risks: None Diabetes: No  How often do you need to have someone help you when you read instructions, pamphlets, or other written materials from your doctor or pharmacy?: 1 -  Never What is the last grade level you completed in school?: 12  Interpreter Needed?: No      Activities of Daily Living    10/17/2023    9:58 AM  In your present state of health, do you have any difficulty performing the following activities:  Hearing? 0  Vision? 0  Difficulty concentrating or making decisions? 0  Walking or climbing stairs? 0  Dressing or bathing? 0  Doing errands, shopping? 0  Preparing Food and eating ? N  Using the Toilet? N  In the past six months, have you accidently leaked urine? Y  Do you have problems with loss of bowel control? N  Managing your Medications? N  Managing your Finances? N  Housekeeping or managing your Housekeeping? N    Patient Care Team: Nolene Ebbs as PCP - General (Family Medicine) Betsey Amen, MD as Referring Physician (Endocrinology) Janit Pagan, Alpha Gula, MD as Consulting Physician (Internal Medicine)  Indicate any recent Medical Services you may have received from other than [facility] providers in the past year (date may be approximate).     Assessment:   This is a routine wellness examination for Betty Atkinson.  Hearing/Vision screen No results found.   Goals Addressed             This Visit's Progress    Activity and Exercise Increased       She would like to keep increasing exercise.       Depression Screen    10/17/2023   10:12 AM 08/27/2023   11:07 AM 06/18/2023   11:25 AM 04/13/2013   12:12 PM  PHQ 2/9 Scores  PHQ - 2 Score 1 2 2  0  PHQ- 9 Score 1 4 4      Fall Risk    10/17/2023   10:16 AM 08/27/2023   11:07 AM 06/18/2023   11:26 AM 04/13/2013   12:12 PM  Fall Risk   Falls in the past year? 1 0 1 No  Number falls in past yr: 0 0 0   Injury with Fall? 0 0 0   Risk for fall due to : No  Fall Risks No Fall Risks    Follow up Falls evaluation completed Falls evaluation completed      MEDICARE RISK AT HOME: Medicare Risk at Home Any stairs in or around the home?: Yes If so, are there any  without handrails?: Yes Home free of loose throw rugs in walkways, pet beds, electrical cords, etc?: Yes Adequate lighting in your home to reduce risk of falls?: Yes Life alert?: No Use of a cane, walker or w/c?: No Grab bars in the bathroom?: Yes Shower chair or bench in shower?: No Elevated toilet seat or a handicapped toilet?: Yes  TIMED UP AND GO:  Was the test performed?  No    Cognitive Function:        10/17/2023   10:20 AM  6CIT Screen  What Year? 0 points  What month? 0 points  What time? 0 points  Count back from 20 0 points  Months in reverse 0 points  Repeat phrase 0 points  Total Score 0 points    Immunizations Immunization History  Administered Date(s) Administered   Fluad Trivalent(High Dose 65+) 07/16/2023   Influenza, High Dose Seasonal PF 04/03/2019, 03/08/2020, 06/06/2021   Influenza,inj,Quad PF,6+ Mos 03/12/2013   Moderna Sars-Covid-2 Vaccination 09/17/2019, 10/20/2019, 07/14/2020   PNEUMOCOCCAL CONJUGATE-20 08/20/2022   Pneumococcal Conjugate-13 05/02/2015   Pneumococcal Polysaccharide-23 05/10/2005, 03/07/2010   Pneumococcal-Unspecified 05/10/2005, 03/07/2010   Td 07/09/1997   Tdap 10/31/2006   Zoster, Live 11/17/2007    TDAP status: Due, Education has been provided regarding the importance of this vaccine. Advised may receive this vaccine at local pharmacy or Health Dept. Aware to provide a copy of the vaccination record if obtained from local pharmacy or Health Dept. Verbalized acceptance and understanding.  Flu Vaccine status: Up to date  Pneumococcal vaccine status: Up to date  Covid-19 vaccine status: Declined, Education has been provided regarding the importance of this vaccine but patient still declined. Advised may receive this vaccine at local pharmacy or Health Dept.or vaccine clinic. Aware to provide a copy of the vaccination record if obtained from local pharmacy or Health Dept. Verbalized acceptance and understanding.  Qualifies  for Shingles Vaccine? Yes   Zostavax completed Yes   Shingrix Completed?: No.    Education has been provided regarding the importance of this vaccine. Patient has been advised to call insurance company to determine out of pocket expense if they have not yet received this vaccine. Advised may also receive vaccine at local pharmacy or Health Dept. Verbalized acceptance and understanding.  Screening Tests Health Maintenance  Topic Date Due   Hepatitis C Screening  Never done   Zoster Vaccines- Shingrix (1 of 2) 04[birth year]11/1965   MAMMOGRAM  03/03/2013   DTaP/Tdap/Td (3 - Td or Tdap) 10/30/2016   COVID-19 Vaccine (4 - 2024-25 season) 09/11/2024 (Originally 03/10/2023)   INFLUENZA VACCINE  02/07/2024   Medicare Annual Wellness (AWV)  10/16/2024   Pneumonia Vaccine 11+ Years old  Completed   DEXA SCAN  Completed   HPV VACCINES  Aged Out   Meningococcal B Vaccine  Aged Out    Health Maintenance  Health Maintenance Due  Topic Date Due   Hepatitis C Screening  Never done   Zoster Vaccines- Shingrix (1 of 2) 04/11/1964   MAMMOGRAM  03/03/2013   DTaP/Tdap/Td (3 - Td or Tdap) 10/30/2016    Colorectal cancer screening: No longer required.   Mammogram status: Ordered  . Pt provided with contact info and advised to call to schedule appt.  Scheduled for  10/23/2023  Bone Density status: Ordered  . Pt provided with contact info and advised to call to schedule appt.Scheduled for 10/23/2023  Lung Cancer Screening: (Low Dose CT Chest recommended if Age 28-80 years, 20 pack-year currently smoking OR have quit w/in 15years.) does not qualify.   Lung Cancer Screening Referral: n/a  Additional Screening:  Hepatitis C Screening: does not qualify; Completed   Vision Screening: Recommended annual ophthalmology exams for early detection of glaucoma and other disorders of the eye. Is the patient up to date with their annual eye exam?  No  Who is the provider or what is the name of the office in which the  patient attends annual eye exams? [facility] If pt is not established with a provider, would they like to be referred to a provider to establish care? No .   Dental Screening: Recommended annual dental exams for proper oral hygiene   Community Resource Referral / Chronic Care Management: CRR required this visit?  No   CCM required this visit?  No     Plan:     I have personally reviewed and noted the following in the patient's chart:   Medical and social history Use of alcohol, tobacco or illicit drugs  Current medications and supplements including opioid prescriptions. Patient is not currently taking opioid prescriptions. Functional ability and status Nutritional status Physical activity Advanced directives List of other physicians Hospitalizations, surgeries, and ER visits in previous 12 months Vitals Screenings to include cognitive, depression, and falls Referrals and appointments  In addition, I have reviewed and discussed with patient certain preventive protocols, quality metrics, and best practice recommendations. A written personalized care plan for preventive services as well as general preventive health recommendations were provided to patient.     Esmond Harps, CMA   10/17/2023   After Visit Summary: (Mail) Due to this being a telephonic visit, the after visit summary with patients personalized plan was offered to patient via mail   Nurse Notes:   Betty Atkinson is a 78 y.o. y.o. female patient of Tandy Gaw, PA-C who had a Medicare Annual Wellness Visit today via telephone. She reports that he is socially active and does interact with friends/family regularly. She is moderately physically active. She loves to do word search.

## 2023-10-17 NOTE — Patient Instructions (Signed)
  Ms. Clegg , Thank you for taking time to come for your Medicare Wellness Visit. I appreciate your ongoing commitment to your health goals. Please review the following plan we discussed and let me know if I can assist you in the future.   These are the goals we discussed:  Goals      Activity and Exercise Increased     She would like to keep increasing exercise.         This is a list of the screening recommended for you and due dates:  Health Maintenance  Topic Date Due   Hepatitis C Screening  Never done   Zoster (Shingles) Vaccine (1 of 2) 04/11/1964   Mammogram  03/03/2013   DTaP/Tdap/Td vaccine (3 - Td or Tdap) 10/30/2016   COVID-19 Vaccine (4 - 2024-25 season) 09/11/2024*   Flu Shot  02/07/2024   Medicare Annual Wellness Visit  10/16/2024   Pneumonia Vaccine  Completed   DEXA scan (bone density measurement)  Completed   HPV Vaccine  Aged Out   Meningitis B Vaccine  Aged Out  *Topic was postponed. The date shown is not the original due date.

## 2023-10-23 ENCOUNTER — Ambulatory Visit: Payer: Medicare Other

## 2023-10-23 DIAGNOSIS — Z1231 Encounter for screening mammogram for malignant neoplasm of breast: Secondary | ICD-10-CM | POA: Diagnosis not present

## 2023-10-23 DIAGNOSIS — M810 Age-related osteoporosis without current pathological fracture: Secondary | ICD-10-CM

## 2023-10-23 DIAGNOSIS — Z780 Asymptomatic menopausal state: Secondary | ICD-10-CM | POA: Diagnosis not present

## 2023-10-28 ENCOUNTER — Other Ambulatory Visit: Payer: Self-pay | Admitting: Physician Assistant

## 2023-10-28 ENCOUNTER — Encounter: Payer: Self-pay | Admitting: Sports Medicine

## 2023-10-28 ENCOUNTER — Encounter: Payer: Self-pay | Admitting: Physician Assistant

## 2023-10-28 ENCOUNTER — Ambulatory Visit (INDEPENDENT_AMBULATORY_CARE_PROVIDER_SITE_OTHER): Admitting: Sports Medicine

## 2023-10-28 DIAGNOSIS — M47812 Spondylosis without myelopathy or radiculopathy, cervical region: Secondary | ICD-10-CM | POA: Diagnosis not present

## 2023-10-28 DIAGNOSIS — M1711 Unilateral primary osteoarthritis, right knee: Secondary | ICD-10-CM

## 2023-10-28 DIAGNOSIS — M7061 Trochanteric bursitis, right hip: Secondary | ICD-10-CM

## 2023-10-28 DIAGNOSIS — M810 Age-related osteoporosis without current pathological fracture: Secondary | ICD-10-CM

## 2023-10-28 NOTE — Assessment & Plan Note (Signed)
 Resolved with injection, hip abductor strength is not significantly better, she will have to work harder on this.

## 2023-10-28 NOTE — Progress Notes (Signed)
    Procedures performed today:    None.  Independent interpretation of notes and tests performed by another provider:   None.  Brief History, Exam, Impression, and Recommendations:    Primary osteoarthritis of right knee Much better with injection, return as needed  Greater trochanteric bursitis of right hip Resolved with injection, hip abductor strength is not significantly better, she will have to work harder on this.  Cervical spondylosis Pain right trapezius after doing some rowing motions for rehab, suspect more of a radicular process. Added some cervical conditioning, she also finds Aspercreme typically takes care of it, no further follow-up needed for this, no imaging needed for this as it is doing quite well already.    ____________________________________________ Joselyn Nicely. Sandy Crumb, M.D., ABFM., CAQSM., AME. Primary Care and Sports Medicine [facility]  Adjunct Professor of [facility] Medicine  [employer]  [employer]  Restaurant manager, fast food

## 2023-10-28 NOTE — Assessment & Plan Note (Signed)
 Pain right trapezius after doing some rowing motions for rehab, suspect more of a radicular process. Added some cervical conditioning, she also finds Aspercreme typically takes care of it, no further follow-up needed for this, no imaging needed for this as it is doing quite well already.

## 2023-10-28 NOTE — Assessment & Plan Note (Signed)
 Much better with injection, return as needed

## 2023-10-28 NOTE — Progress Notes (Signed)
 Betty Atkinson,   You are in osteoporosis range. You are not on any medication for this correct?  I would suggest starting evenity monthly for one year then transitioning to prolia(every 6 months) to help get bone density back up. Thoughts?

## 2023-10-28 NOTE — Progress Notes (Signed)
 Normal mammogram. Follow up in 1 year.

## 2023-11-07 DIAGNOSIS — K080 Exfoliation of teeth due to systemic causes: Secondary | ICD-10-CM | POA: Diagnosis not present

## 2023-11-15 DIAGNOSIS — E0590 Thyrotoxicosis, unspecified without thyrotoxic crisis or storm: Secondary | ICD-10-CM | POA: Diagnosis not present

## 2023-12-11 DIAGNOSIS — N1832 Chronic kidney disease, stage 3b: Secondary | ICD-10-CM | POA: Diagnosis not present

## 2023-12-16 DIAGNOSIS — N1832 Chronic kidney disease, stage 3b: Secondary | ICD-10-CM | POA: Diagnosis not present

## 2023-12-17 ENCOUNTER — Ambulatory Visit: Payer: Medicare Other | Admitting: Physician Assistant

## 2024-01-11 ENCOUNTER — Other Ambulatory Visit: Payer: Self-pay | Admitting: Physician Assistant

## 2024-01-13 DIAGNOSIS — E0590 Thyrotoxicosis, unspecified without thyrotoxic crisis or storm: Secondary | ICD-10-CM | POA: Diagnosis not present

## 2024-01-22 DIAGNOSIS — K080 Exfoliation of teeth due to systemic causes: Secondary | ICD-10-CM | POA: Diagnosis not present

## 2024-02-07 ENCOUNTER — Ambulatory Visit: Payer: Self-pay

## 2024-02-07 NOTE — Telephone Encounter (Signed)
 FYI Only or Action Required?: Action required by provider: request for appointment.  Patient was last seen in primary care on 10/28/2023 by Curtis Debby PARAS, MD.  Called Nurse Triage reporting Urinary Tract Infection.  Symptoms began a week ago.  Interventions attempted: Nothing.  Symptoms are: unchanged.  Triage Disposition: See PCP Within 2 Weeks  Patient/caregiver understands and will follow disposition?: YesCopied from CRM [id number]. Topic: Clinical - Red Word Triage >> Feb 07, 2024  9:47 AM Zane F wrote: [facility] that prompted transfer to Nurse Triage:   Possible bladder infection  Stomach pain near belly button; a lot of pressure when she urinates Reason for Disposition  All other urine symptoms  Answer Assessment - Initial Assessment Questions Pressure and frequency issues started a week ago. No appts until Monday. Pt wanted to be seen In office Monday. RN advised if symptoms worsen to seek care at [facility] and keep appt for follow up.    1. SYMPTOM: What's the main symptom you're concerned about? (e.g., frequency, incontinence)     pressure 2. ONSET: When did the    start?     1 week ago 3. PAIN: Is there any pain? If Yes, ask: How bad is it? (Scale: 1-10; mild, moderate, severe)     2 4. CAUSE: What do you think is causing the symptoms?     Possible UTI 5. OTHER SYMPTOMS: Do you have any other symptoms? (e.g., blood in urine, fever, flank pain, pain with urination)     Pain in belly button area.Pain is achy, increase in frequency  Protocols used: Urinary Symptoms-A-AH

## 2024-02-07 NOTE — Telephone Encounter (Signed)
 Patient scheduled for 02/10/2024 with Zada Palin, NP

## 2024-02-10 ENCOUNTER — Ambulatory Visit

## 2024-02-10 ENCOUNTER — Ambulatory Visit (INDEPENDENT_AMBULATORY_CARE_PROVIDER_SITE_OTHER): Admitting: Medical-Surgical

## 2024-02-10 ENCOUNTER — Encounter: Payer: Self-pay | Admitting: Medical-Surgical

## 2024-02-10 VITALS — BP 109/68 | HR 69 | Resp 20 | Ht 60.0 in | Wt 151.1 lb

## 2024-02-10 DIAGNOSIS — R3989 Other symptoms and signs involving the genitourinary system: Secondary | ICD-10-CM | POA: Diagnosis not present

## 2024-02-10 DIAGNOSIS — N200 Calculus of kidney: Secondary | ICD-10-CM | POA: Diagnosis not present

## 2024-02-10 DIAGNOSIS — R300 Dysuria: Secondary | ICD-10-CM | POA: Diagnosis not present

## 2024-02-10 LAB — POCT URINALYSIS DIP (CLINITEK)
Bilirubin, UA: NEGATIVE
Blood, UA: NEGATIVE
Glucose, UA: NEGATIVE mg/dL
Ketones, POC UA: NEGATIVE mg/dL
Leukocytes, UA: NEGATIVE
Nitrite, UA: NEGATIVE
POC PROTEIN,UA: NEGATIVE
Spec Grav, UA: 1.025 (ref 1.010–1.025)
Urobilinogen, UA: 0.2 U/dL
pH, UA: 5.5 (ref 5.0–8.0)

## 2024-02-10 NOTE — Patient Instructions (Signed)
 Ok to use Dulcolax 10mg  daily today and tomorrow if needed. OR Miralax 17g daily for 3-4 days.

## 2024-02-10 NOTE — Progress Notes (Signed)
        Established patient visit   History of Present Illness   Discussed the use of AI scribe software for clinical note transcription with the patient, who gave verbal consent to proceed.  History of Present Illness   Betty Atkinson is a 78 year old female who presents with urinary pressure and lower back pain.  She has experienced urinary pressure and lower back pain for about a week. The urinary sensation is pressure without burning, accompanied by periumbilical discomfort. There is no fever, chills, nausea, vomiting, hematuria, urinary or vaginal odor, or diarrhea. Urine is clear, and bowel movements are regular. Tylenol  has provided minimal relief.  Her medical history includes diverticulitis and kidney stones, though current symptoms differ from past nephrolithiasis episodes. She is under treatment for Graves' disease with recent thyroid  medication adjustments and upcoming blood work.      Physical Exam   Physical Exam Vitals reviewed.  Constitutional:      General: She is not in acute distress.    Appearance: Normal appearance.  HENT:     Head: Normocephalic and atraumatic.  Cardiovascular:     Rate and Rhythm: Normal rate and regular rhythm.     Pulses: Normal pulses.     Heart sounds: Normal heart sounds. No murmur heard.    No friction rub. No gallop.  Pulmonary:     Effort: Pulmonary effort is normal. No respiratory distress.     Breath sounds: Normal breath sounds. No wheezing.  Skin:    General: Skin is warm and dry.  Neurological:     Mental Status: She is alert and oriented to person, place, and time.  Psychiatric:        Mood and Affect: Mood normal.        Behavior: Behavior normal.        Thought Content: Thought content normal.        Judgment: Judgment normal.     Assessment & Plan   Assessment and Plan    Constipation Urinary symptoms likely due to constipation causing bladder pressure. Normal urinalysis. Differential includes vaginal atrophy and  stool burden. Diverticulitis and nephrolithiasis unlikely. - Order abdominal x-ray to assess stool burden or abnormalities. - Recommend Miralax 17 grams daily for 3-4 days. - If preferred, ok to use Dulcolax up to 10 mg daily for 1-2 days if immediate relief needed. - Follow-up call with abdominal x-ray results.      Follow up   Return if symptoms worsen or fail to improve.  __________________________________ Zada FREDRIK Palin, DNP, APRN, FNP-BC Primary Care and Sports Medicine [facility]

## 2024-02-14 ENCOUNTER — Telehealth: Payer: Self-pay

## 2024-02-14 NOTE — Telephone Encounter (Signed)
 Copied from CRM [id number]. Topic: Clinical - Lab/Test Results >> Feb 14, 2024 11:57 AM Diannia H wrote: Reason for CRM: Patient was calling to check the results of her xray. Informed the patient that the results were not ready and she would like a call once they are ready. Patients callback is [phone].

## 2024-02-14 NOTE — Telephone Encounter (Signed)
 Message sent to patient via Mychart regarding radiology shortage and images taking longer to result. Would you like me to call radiology reading room and have the x-ray changed to STAT  If so, they are now requiring a reason that it is requested to be changed to STAT- Please advise.

## 2024-02-17 ENCOUNTER — Ambulatory Visit: Payer: Self-pay | Admitting: Medical-Surgical

## 2024-02-17 DIAGNOSIS — E0590 Thyrotoxicosis, unspecified without thyrotoxic crisis or storm: Secondary | ICD-10-CM | POA: Diagnosis not present

## 2024-02-24 ENCOUNTER — Encounter: Payer: Self-pay | Admitting: Physician Assistant

## 2024-02-24 ENCOUNTER — Ambulatory Visit (INDEPENDENT_AMBULATORY_CARE_PROVIDER_SITE_OTHER): Payer: Medicare Other | Admitting: Physician Assistant

## 2024-02-24 VITALS — BP 138/66 | HR 66 | Ht 60.0 in | Wt 154.0 lb

## 2024-02-24 DIAGNOSIS — R000 Tachycardia, unspecified: Secondary | ICD-10-CM | POA: Diagnosis not present

## 2024-02-24 DIAGNOSIS — E0590 Thyrotoxicosis, unspecified without thyrotoxic crisis or storm: Secondary | ICD-10-CM

## 2024-02-24 DIAGNOSIS — J4540 Moderate persistent asthma, uncomplicated: Secondary | ICD-10-CM

## 2024-02-24 DIAGNOSIS — K5900 Constipation, unspecified: Secondary | ICD-10-CM

## 2024-02-24 DIAGNOSIS — N1832 Chronic kidney disease, stage 3b: Secondary | ICD-10-CM

## 2024-02-24 DIAGNOSIS — E0500 Thyrotoxicosis with diffuse goiter without thyrotoxic crisis or storm: Secondary | ICD-10-CM

## 2024-02-24 DIAGNOSIS — E782 Mixed hyperlipidemia: Secondary | ICD-10-CM

## 2024-02-24 NOTE — Patient Instructions (Signed)
 Get Tdap and Shingles vaccine at pharmacy.

## 2024-02-26 ENCOUNTER — Encounter: Payer: Self-pay | Admitting: Physician Assistant

## 2024-02-26 DIAGNOSIS — K5900 Constipation, unspecified: Secondary | ICD-10-CM | POA: Insufficient documentation

## 2024-02-26 DIAGNOSIS — E0500 Thyrotoxicosis with diffuse goiter without thyrotoxic crisis or storm: Secondary | ICD-10-CM | POA: Insufficient documentation

## 2024-02-26 MED ORDER — METOPROLOL SUCCINATE ER 50 MG PO TB24: 50.0000 mg | ORAL_TABLET | Freq: Every day | ORAL | 1 refills | Status: AC

## 2024-02-26 MED ORDER — ROSUVASTATIN CALCIUM 20 MG PO TABS: 20.0000 mg | ORAL_TABLET | Freq: Every day | ORAL | 0 refills | Status: AC

## 2024-02-26 NOTE — Progress Notes (Signed)
 Established Patient Office Visit  Subjective   Patient ID: Sharanya Templin, female    DOB: May 25, 1946  Age: 78 y.o. MRN: [id number]  Chief Complaint  Patient presents with   Medical Management of Chronic Issues    HPI Pt is a 78 yo female with Graves disease, CKD 3b, HLD, sinus tachycardia who presents to the clinic for 6 month follow up.   Pt is doing ok. She feels like overall she is having some hyperthyroid symptoms and working with endocrinology to adjust medications.   She continues to keep follow up with nephrology and doing well with kidney function staying stable.   She denies any palpitations, headaches, dizziness, chest pains. She is taking metoprolol  and crestor .   She does have some intermittent bladder pressure and came into office and saw Zada Palin NP. Xray of abdomen was done but she has not heard anything. She did use miralax and helped with passing stool.    Review of Systems  All other systems reviewed and are negative.     Objective:     BP 138/66   Pulse 66   Ht 5' (1.524 m)   Wt 154 lb (69.9 kg)   SpO2 99%   BMI 30.08 kg/m  BP Readings from Last 3 Encounters:  02/24/24 138/66  02/10/24 109/68  08/27/23 (!) 121/55   Wt Readings from Last 3 Encounters:  02/24/24 154 lb (69.9 kg)  02/10/24 151 lb 1.3 oz (68.5 kg)  10/17/23 146 lb (66.2 kg)      Physical Exam Constitutional:      Appearance: Normal appearance.  HENT:     Head: Normocephalic.  Cardiovascular:     Rate and Rhythm: Normal rate and regular rhythm.  Pulmonary:     Effort: Pulmonary effort is normal.     Breath sounds: Normal breath sounds.  Abdominal:     General: There is no distension.     Palpations: Abdomen is soft. There is no mass.     Tenderness: There is no abdominal tenderness. There is no right CVA tenderness, left CVA tenderness, guarding or rebound.     Hernia: No hernia is present.  Musculoskeletal:     Cervical back: Neck supple. No tenderness.     Right lower  leg: No edema.     Left lower leg: No edema.  Lymphadenopathy:     Cervical: No cervical adenopathy.  Neurological:     Mental Status: She is alert and oriented to person, place, and time.  Psychiatric:        Mood and Affect: Mood normal.       The 10-year ASCVD risk score (Arnett DK, et al., 2019) is: 25.9%    Assessment & Plan:  SABRASABRAUbah was seen today for medical management of chronic issues.  Diagnoses and all orders for this visit:  Sinus tachycardia -     metoprolol  succinate (TOPROL -XL) 50 MG 24 hr tablet; Take 1 tablet (50 mg total) by mouth daily.  CKD stage 3b, GFR 30-44 ml/min (HCC)  Moderate persistent asthma without complication  Hyperthyroidism  Graves disease  Constipation, unspecified constipation type  Mixed hyperlipidemia -     rosuvastatin  (CRESTOR ) 20 MG tablet; Take 1 tablet (20 mg total) by mouth daily.   Labs UTD Refilled crestor  and metoprolol  Vitals look good today Endocrinology is working with her medication to control her hyperthyroidism She has follow up every 6 months with nephrology for her CKD She intermittently has some bladder pressure Xray done recently  did show some stool in sigmoid colon that could be pressing up against bladder from time to time Suggest increasing water for hydration Consider stool softener or even miralax as needed and increasing fiber in stool Follow up as needed  Encouraged to get Tdap and Shingles at pharmacy   Return in about 6 months (around 08/26/2024).    Nashon Erbes, PA-C

## 2024-03-10 ENCOUNTER — Encounter: Payer: Self-pay | Admitting: Sports Medicine

## 2024-03-23 ENCOUNTER — Other Ambulatory Visit: Payer: Self-pay | Admitting: Physician Assistant

## 2024-03-24 NOTE — Telephone Encounter (Unsigned)
 Copied from CRM [id number]. Topic: Clinical - Medication Refill >> Mar 24, 2024  3:39 PM Carrielelia G wrote: Medication: potassium chloride  (KLOR-CON  M) 10 MEQ tablet  Has the patient contacted their pharmacy? Yes (Agent: If no, request that the patient contact the pharmacy for the refill. If patient does not wish to contact the pharmacy document the reason why and proceed with request.) (Agent: If yes, when and what did the pharmacy advise?)  This is the patient's preferred pharmacy:  [facility] [address] - [place], [place] - [address] [place] [address] Phone: [phone] Fax: [phone]  Is this the correct pharmacy for this prescription? Yes If no, delete pharmacy and type the correct one.     Is the patient out of the medication? Yes  Has the patient been seen for an appointment in the last year OR does the patient have an upcoming appointment? Yes  Can we respond through MyChart? No  Agent: Please be advised that Rx refills may take up to 3 business days. We ask that you follow-up with your pharmacy.

## 2024-03-25 NOTE — Telephone Encounter (Signed)
 Requesting rx rf of potassium 10 meq Last written

## 2024-03-30 DIAGNOSIS — E0590 Thyrotoxicosis, unspecified without thyrotoxic crisis or storm: Secondary | ICD-10-CM | POA: Diagnosis not present

## 2024-04-06 DIAGNOSIS — E0500 Thyrotoxicosis with diffuse goiter without thyrotoxic crisis or storm: Secondary | ICD-10-CM | POA: Diagnosis not present

## 2024-04-06 DIAGNOSIS — E0590 Thyrotoxicosis, unspecified without thyrotoxic crisis or storm: Secondary | ICD-10-CM | POA: Diagnosis not present

## 2024-05-18 DIAGNOSIS — K080 Exfoliation of teeth due to systemic causes: Secondary | ICD-10-CM | POA: Diagnosis not present

## 2024-05-19 DIAGNOSIS — E0500 Thyrotoxicosis with diffuse goiter without thyrotoxic crisis or storm: Secondary | ICD-10-CM | POA: Diagnosis not present

## 2024-06-11 DIAGNOSIS — N1832 Chronic kidney disease, stage 3b: Secondary | ICD-10-CM | POA: Diagnosis not present

## 2024-06-16 DIAGNOSIS — N1832 Chronic kidney disease, stage 3b: Secondary | ICD-10-CM | POA: Diagnosis not present

## 2024-07-25 ENCOUNTER — Other Ambulatory Visit: Payer: Self-pay | Admitting: Physician Assistant

## 2024-08-28 ENCOUNTER — Encounter: Admitting: Physician Assistant

## 2024-10-20 ENCOUNTER — Ambulatory Visit
